# Patient Record
Sex: Female | Born: 1985
Health system: Southern US, Community
[De-identification: ages and names within clinical notes are randomized; demographics above are authoritative.]

## PROBLEM LIST (undated history)

## (undated) DIAGNOSIS — F32A Depression, unspecified: Secondary | ICD-10-CM

## (undated) DIAGNOSIS — M940 Chondrocostal junction syndrome [Tietze]: Secondary | ICD-10-CM

## (undated) HISTORY — DX: Depression, unspecified: F32.A

## (undated) HISTORY — PX: ADENOIDECTOMY: SUR15

## (undated) HISTORY — PX: WISDOM TOOTH EXTRACTION: SHX21

## (undated) HISTORY — PX: OTHER SURGICAL HISTORY: SHX169

## (undated) HISTORY — PX: BREAST SURGERY: SHX581

---

## 2014-05-24 ENCOUNTER — Other Ambulatory Visit (HOSPITAL_COMMUNITY)
Admission: RE | Admit: 2014-05-24 | Discharge: 2014-05-24 | Disposition: A | Discharge status: Home / Self Care | Payer: 59 | Source: Ambulatory Visit | Attending: Obstetrics & Gynecology | Admitting: Obstetrics & Gynecology

## 2014-05-24 DIAGNOSIS — Z01419 Encounter for gynecological examination (general) (routine) without abnormal findings: Secondary | ICD-10-CM | POA: Diagnosis not present

## 2014-05-24 DIAGNOSIS — Z1151 Encounter for screening for human papillomavirus (HPV): Secondary | ICD-10-CM | POA: Diagnosis present

## 2014-05-24 DIAGNOSIS — Z113 Encounter for screening for infections with a predominantly sexual mode of transmission: Secondary | ICD-10-CM | POA: Diagnosis present

## 2014-08-19 ENCOUNTER — Encounter (HOSPITAL_COMMUNITY): Payer: Self-pay | Admitting: *Deleted

## 2014-08-19 ENCOUNTER — Emergency Department (HOSPITAL_COMMUNITY): Payer: 59

## 2014-08-19 ENCOUNTER — Emergency Department (HOSPITAL_COMMUNITY)
Admission: EM | Admit: 2014-08-19 | Discharge: 2014-08-19 | Disposition: A | Discharge status: Home / Self Care | Payer: 59 | Attending: Emergency Medicine | Admitting: Emergency Medicine

## 2014-08-19 DIAGNOSIS — R112 Nausea with vomiting, unspecified: Secondary | ICD-10-CM | POA: Insufficient documentation

## 2014-08-19 DIAGNOSIS — R059 Cough, unspecified: Secondary | ICD-10-CM

## 2014-08-19 DIAGNOSIS — M791 Myalgia: Secondary | ICD-10-CM | POA: Diagnosis not present

## 2014-08-19 DIAGNOSIS — R05 Cough: Secondary | ICD-10-CM | POA: Diagnosis present

## 2014-08-19 HISTORY — DX: Chondrocostal junction syndrome (tietze): M94.0

## 2014-08-19 MED ORDER — ONDANSETRON 4 MG PO TBDP: 4.0000 mg | ORAL_TABLET | Freq: Three times a day (TID) | ORAL | Status: DC | PRN

## 2014-08-19 MED ORDER — HYDROCOD POLST-CHLORPHEN POLST 10-8 MG/5ML PO LQCR
5.0000 mL | Freq: Two times a day (BID) | ORAL | Status: DC | PRN
Start: 2014-08-19 — End: 2018-03-19

## 2014-08-19 NOTE — ED Notes (Signed)
Patient transported to X-ray 

## 2014-08-19 NOTE — ED Provider Notes (Signed)
CSN: [id number]     Arrival date & time 08/19/14  0700 History   First MD Initiated Contact with Patient 08/19/14 [phone]     Chief Complaint  Patient presents with  . Cough     (Consider location/radiation/quality/duration/timing/severity/associated sxs/prior Treatment) Patient is a 29 y.o. female presenting with cough. The history is provided by the patient and medical records.  Cough Associated symptoms: myalgias    This is a 29 y.o. F with no significant PMH presenting to the ED for cough, subjective fever, body aches, nausea, and vomiting intermittently for the past 5 days.  She states the nausea and vomiting have resolved but she continues with all of her other symptoms. States cough is been productive with thick, white sputum but she became concerned this morning when there were small streaks of blood present. Patient denies any chest pain or shortness of breath. 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 She has no known cardiac history. No history of DVT or PE. No intervention tried prior to arrival.  VSS.  Past Medical History  Diagnosis Date  . Costochondritis    History reviewed. No pertinent past surgical history. History reviewed. No pertinent family history. History  Substance Use Topics  . Smoking status: Not on file  . Smokeless tobacco: Not on file  . Alcohol Use: Yes     Comment: occ   OB History    No data available     Review of Systems  Respiratory: Positive for cough.   Gastrointestinal: Positive for nausea and vomiting.  Musculoskeletal: Positive for myalgias.  All other systems reviewed and are negative.     Allergies  Review of patient's allergies indicates no known allergies.  Home Medications   Prior to Admission medications   Not on File   BP 109/73 mmHg  Pulse 84  Temp(Src) 98.4 F (36.9 C) (Oral)  Resp 20  Ht 5\' 4"  (1.626 m)  Wt 120 lb (54.432 kg)  BMI 20.59 kg/m2  SpO2 98%  LMP  08/14/2014   Physical Exam  Constitutional: She is oriented to person, place, and time. She appears well-developed and well-nourished. No distress.  HENT:  Head: Normocephalic and atraumatic.  Right Ear: Tympanic membrane and ear canal normal.  Left Ear: Tympanic membrane and ear canal normal.  Nose: Nose normal.  Mouth/Throat: Oropharynx is clear and moist.  Tonsils normal in appearance bilaterally without exudate; uvula midline without peritonsillar abscess; handling secretions appropriately; no difficulty swallowing or speaking  Eyes: Conjunctivae and EOM are normal. Pupils are equal, round, and reactive to light.  Neck: Normal range of motion. Neck supple.  Cardiovascular: Normal rate, regular rhythm and normal heart sounds.   Pulmonary/Chest: Effort normal and breath sounds normal. No respiratory distress. She has no wheezes. She has no rhonchi. She has no rales.  Respirations unlabored, lungs clear bilaterally, O2 sats stable on room air; dry cough noted  Abdominal: Soft. Bowel sounds are normal. There is no tenderness. There is no guarding and no CVA tenderness.  Musculoskeletal: Normal range of motion. She exhibits no edema.  No calf asymmetry, tenderness, or palpable cords No overlying skin changes or warmth to touch DP pulses intact bilaterally  Neurological: She is alert and oriented to person, place, and time.  Skin: Skin is warm and dry. She is not diaphoretic.  Psychiatric: She has a normal mood and affect.  Nursing note and vitals reviewed.   ED Course  Procedures (  including critical care time) Labs Review Labs Reviewed - No data to display  Imaging Review Dg Chest 2 View  08/19/2014   CLINICAL DATA:  Hematemesis, fever, chills, productive cough since 5 days ago  EXAM: CHEST  2 VIEW  COMPARISON:  None.  FINDINGS: The heart size and mediastinal contours are within normal limits. Both lungs are clear. The visualized skeletal structures are unremarkable.  IMPRESSION: No  active cardiopulmonary disease.   Electronically Signed   By: Judie Petit.  Shick M.D.   On: 08/19/2014 08:20     EKG Interpretation None      MDM   Final diagnoses:  Cough   29 year old female with URI type symptoms over the past week. Nausea and vomiting have resolved, continues having productive cough. States this morning she had streaks of blood present in her sputum and became concerned. On exam, patient is afebrile and nontoxic in appearance. She has a dry cough, but lungs are clear and there is no evidence of respiratory distress. Chest x-ray is negative for acute findings. No clinical signs of DVT on exam and patient is PERC negative making PE unlikely. Also doubt ACS.  Patient will be d/c home with supportive care.  FU with PCP.  Discussed plan with patient, he/she acknowledged understanding and agreed with plan of care.  Return precautions given for new or worsening symptoms.  Garlon Hatchet, PA-C 08/19/14 1030  Benjiman Core, MD 08/19/14 [phone]

## 2014-08-19 NOTE — Discharge Instructions (Signed)
Take the prescribed medication as directed.  Rest and drink plenty of fluids. °Follow-up with your primary care physician. °Return to the ED for new or worsening symptoms. ° °

## 2014-08-19 NOTE — ED Notes (Signed)
Pt reports not feeling well since tues. Having productive cough and fever. Now coughing up blood. Mask on pt at triage.

## 2014-08-19 NOTE — ED Notes (Signed)
Lisa, PA at the bedside.  

## 2014-09-01 ENCOUNTER — Ambulatory Visit (INDEPENDENT_AMBULATORY_CARE_PROVIDER_SITE_OTHER): Payer: Self-pay | Admitting: Emergency Medicine

## 2014-09-01 VITALS — BP 100/68 | HR 92 | Temp 97.9°F | Resp 20 | Ht 65.0 in | Wt 121.8 lb

## 2014-09-01 DIAGNOSIS — Z021 Encounter for pre-employment examination: Secondary | ICD-10-CM

## 2014-09-01 DIAGNOSIS — Z029 Encounter for administrative examinations, unspecified: Secondary | ICD-10-CM

## 2014-09-01 NOTE — Progress Notes (Signed)
  [facility] [address], [place] [address] [phone]  Date:  09/01/2014   Name:  Crystal Arellano   DOB:  Jun 20, 1985   MRN:  [id number]  PCP:  No primary care provider on file.    Chief Complaint: Employment Physical   History of Present Illness:  Crystal FurbishShante Karolee StampsHarris Arellano is a 29 y.o. very pleasant female patient who presents with the following:  BLET exam  There are no active problems to display for this patient.   Past Medical History  Diagnosis Date  . Costochondritis     Past Surgical History  Procedure Laterality Date  . Breast surgery      History  Substance Use Topics  . Smoking status: Never Smoker   . Smokeless tobacco: Not on file  . Alcohol Use: 0.0 oz/week    0 Standard drinks or equivalent per week     Comment: occ    Family History  Problem Relation Age of Onset  . Cancer Mother   . Hyperlipidemia Mother     No Known Allergies  Medication list has been reviewed and updated.  Current Outpatient Prescriptions on File Prior to Visit  Medication Sig Dispense Refill  . chlorpheniramine-HYDROcodone (TUSSIONEX PENNKINETIC ER) 10-8 MG/5ML LQCR Take 5 mLs by mouth every 12 (twelve) hours as needed for cough (Cough). (Patient not taking: Reported on 09/01/2014) 75 mL 0  . ondansetron (ZOFRAN ODT) 4 MG disintegrating tablet Take 1 tablet (4 mg total) by mouth every 8 (eight) hours as needed for nausea. (Patient not taking: Reported on 09/01/2014) 10 tablet 0   No current facility-administered medications on file prior to visit.    Review of Systems:  As per HPI, otherwise negative.    Physical Examination: Filed Vitals:   09/01/14 1406  BP: 100/68  Pulse: 92  Temp: 97.9 F (36.6 C)  Resp: 20   Filed Vitals:   09/01/14 1406  Height: 5\' 5"  (1.651 m)  Weight: 121 lb 12.8 oz (55.248 kg)   Body mass index is 20.27 kg/(m^2). Ideal Body Weight: Weight in (lb) to have BMI = 25: 149.9  GEN: WDWN, NAD, Non-toxic,  A & O x 3 HEENT: Atraumatic, Normocephalic. Neck supple. No masses, No LAD. Ears and Nose: No external deformity. CV: RRR, No M/G/R. No JVD. No thrill. No extra heart sounds. PULM: CTA B, no wheezes, crackles, rhonchi. No retractions. No resp. distress. No accessory muscle use. ABD: S, NT, ND, +BS. No rebound. No HSM. EXTR: No c/c/e NEURO Normal gait.  PSYCH: Normally interactive. Conversant. Not depressed or anxious appearing.  Calm demeanor.    Assessment and Plan: Administrative exam  Signed,  Phillips OdorJeffery Aiana Nordquist, MD

## 2014-11-09 ENCOUNTER — Ambulatory Visit
Admission: RE | Admit: 2014-11-09 | Discharge: 2014-11-09 | Disposition: A | Discharge status: Home / Self Care | Payer: 59 | Source: Ambulatory Visit | Attending: Nurse Practitioner | Admitting: Nurse Practitioner

## 2014-11-09 ENCOUNTER — Other Ambulatory Visit: Payer: Self-pay | Admitting: Nurse Practitioner

## 2014-11-09 DIAGNOSIS — R109 Unspecified abdominal pain: Secondary | ICD-10-CM

## 2014-11-13 ENCOUNTER — Other Ambulatory Visit: Payer: Self-pay | Admitting: Internal Medicine

## 2014-11-13 DIAGNOSIS — R1012 Left upper quadrant pain: Secondary | ICD-10-CM

## 2014-11-14 ENCOUNTER — Ambulatory Visit
Admission: RE | Admit: 2014-11-14 | Discharge: 2014-11-14 | Disposition: A | Discharge status: Home / Self Care | Payer: 59 | Source: Ambulatory Visit | Attending: Internal Medicine | Admitting: Internal Medicine

## 2014-11-14 DIAGNOSIS — R1012 Left upper quadrant pain: Secondary | ICD-10-CM

## 2015-12-27 ENCOUNTER — Ambulatory Visit
Admission: RE | Admit: 2015-12-27 | Discharge: 2015-12-27 | Disposition: A | Discharge status: Home / Self Care | Payer: BLUE CROSS/BLUE SHIELD | Source: Ambulatory Visit | Attending: Family Medicine | Admitting: Family Medicine

## 2015-12-27 ENCOUNTER — Other Ambulatory Visit: Payer: Self-pay | Admitting: Family Medicine

## 2015-12-27 DIAGNOSIS — R61 Generalized hyperhidrosis: Secondary | ICD-10-CM | POA: Insufficient documentation

## 2017-03-06 ENCOUNTER — Ambulatory Visit (HOSPITAL_COMMUNITY)
Admission: EM | Admit: 2017-03-06 | Discharge: 2017-03-06 | Disposition: A | Discharge status: Home / Self Care | Payer: BLUE CROSS/BLUE SHIELD | Attending: Family | Admitting: Family

## 2017-03-06 ENCOUNTER — Encounter (HOSPITAL_COMMUNITY): Payer: Self-pay | Admitting: Emergency Medicine

## 2017-03-06 DIAGNOSIS — R21 Rash and other nonspecific skin eruption: Secondary | ICD-10-CM | POA: Diagnosis not present

## 2017-03-06 MED ORDER — PREDNISONE 10 MG PO TABS: ORAL_TABLET | ORAL | 0 refills | Status: DC

## 2017-03-06 NOTE — ED Triage Notes (Signed)
Pt c/o rash on forehead yesterday.

## 2017-03-06 NOTE — Discharge Instructions (Signed)
As discussed, suspect contact dermatitis as recently let hair down. We'll start oral prednisone since in your hairline and worsening. Start prednisone tomorrow as may interfere with sleep.  Benadryl OR zyrtec for itching.  If there is no improvement in your symptoms, or if there is any worsening of symptoms, or if you have any additional concerns, please return for re-evaluation; or, if we are closed, consider going to the Emergency Room for evaluation if symptoms urgent.

## 2017-03-06 NOTE — ED Provider Notes (Signed)
[facility]    CSN: [id number] Arrival date & time: 03/06/17  1628     History   Chief Complaint Chief Complaint  Patient presents with  . Rash    HPI Crystal Arellano is a 31 y.o. female.   Chief complaint of rash on bilateral forehead under hair line, 1 day, unchanged. Thinks has spread from left of forehead and now to right.  Tried witch hazel and cortizone with no relief. Itchy. No drainage.   No fever, vomiting, headache, sore throat, ear pain, cough, vision changes, abdominal pain.   New detergents, creams, lotions, medication. Recently states she has grown her hair and ( first since 2008) and has let braid down on face, 3 days ago. NO other changes she can recall.   NO one else in household has rash.   No ho mrsa.   H/o cold sore on lip however has been years since last sore.       Past Medical History:  Diagnosis Date  . Costochondritis     There are no active problems to display for this patient.   Past Surgical History:  Procedure Laterality Date  . ADENOIDECTOMY    . BREAST SURGERY      OB History    No data available       Home Medications    Prior to Admission medications   Medication Sig Start Date End Date Taking? Authorizing Provider  chlorpheniramine-HYDROcodone (TUSSIONEX PENNKINETIC ER) 10-8 MG/5ML LQCR Take 5 mLs by mouth every 12 (twelve) hours as needed for cough (Cough). Patient not taking: Reported on 09/01/2014 08/19/14   Garlon Hatchet, PA-C  ondansetron (ZOFRAN ODT) 4 MG disintegrating tablet Take 1 tablet (4 mg total) by mouth every 8 (eight) hours as needed for nausea. Patient not taking: Reported on 09/01/2014 08/19/14   Garlon Hatchet, PA-C  predniSONE (DELTASONE) 10 MG tablet Take 40 mg by mouth on day 1, then taper 10 mg daily until gone 03/06/17   Allegra Grana, FNP    Family History Family History  Problem Relation Age of Onset  . Cancer Mother   . Hyperlipidemia Mother     Social  History Social History  Substance Use Topics  . Smoking status: Never Smoker  . Smokeless tobacco: Not on file  . Alcohol use 0.0 oz/week     Comment: occ     Allergies   Patient has no known allergies.   Review of Systems Review of Systems  Constitutional: Negative for chills and fever.  Respiratory: Negative for cough.   Cardiovascular: Negative for chest pain and palpitations.  Gastrointestinal: Negative for nausea and vomiting.  Skin: Positive for rash.     Physical Exam Triage Vital Signs ED Triage Vitals [03/06/17 1641]  Enc Vitals Group     BP 111/67     Pulse Rate 95     Resp 18     Temp 98.1 F (36.7 C)     Temp Source Oral     SpO2 99 %     Weight      Height      Head Circumference      Peak Flow      Pain Score      Pain Loc      Pain Edu?      Excl. in GC?    No data found.   Updated Vital Signs BP 111/67   Pulse 95   Temp 98.1 F (36.7 C) (Oral)  Resp 18   LMP 03/05/2017   SpO2 99%   Visual Acuity Right Eye Distance:   Left Eye Distance:   Bilateral Distance:    Right Eye Near:   Left Eye Near:    Bilateral Near:     Physical Exam  Constitutional: She appears well-developed and well-nourished.  HENT:  Head: Normocephalic and atraumatic.  Right Ear: Hearing, tympanic membrane, external ear and ear canal normal. No drainage, swelling or tenderness. No foreign bodies. Tympanic membrane is not erythematous and not bulging. No middle ear effusion. No decreased hearing is noted.  Left Ear: Hearing, tympanic membrane, external ear and ear canal normal. No drainage, swelling or tenderness. No foreign bodies. Tympanic membrane is not erythematous and not bulging.  No middle ear effusion. No decreased hearing is noted.  Nose: Nose normal. No rhinorrhea. Right sinus exhibits no maxillary sinus tenderness and no frontal sinus tenderness. Left sinus exhibits no maxillary sinus tenderness and no frontal sinus tenderness.  Mouth/Throat: Uvula is  midline, oropharynx is clear and moist and mucous membranes are normal. No oropharyngeal exudate, posterior oropharyngeal edema, posterior oropharyngeal erythema or tonsillar abscesses.  Eyes: Conjunctivae are normal.  Cardiovascular: Normal rate, regular rhythm, normal heart sounds and normal pulses.   Pulmonary/Chest: Effort normal and breath sounds normal. She has no wheezes. She has no rhonchi. She has no rales.  Lymphadenopathy:       Head (right side): No submental, no submandibular, no tonsillar, no preauricular, no posterior auricular and no occipital adenopathy present.       Head (left side): No submental, no submandibular, no tonsillar, no preauricular, no posterior auricular and no occipital adenopathy present.    She has no cervical adenopathy.  Neurological: She is alert.  Skin: Skin is warm and dry. Rash noted. Rash is vesicular.     Grouped vescicular lesions noted in bilateral areas of hairline as marked on diagram.   Psychiatric: She has a normal mood and affect. Her speech is normal and behavior is normal. Thought content normal.  Vitals reviewed.    UC Treatments / Results  Labs (all labs ordered are listed, but only abnormal results are displayed) Labs Reviewed - No data to display  EKG  EKG Interpretation None       Radiology No results found.  Procedures Procedures (including critical care time)  Medications Ordered in UC Medications - No data to display   Initial Impression / Assessment and Plan / UC Course  I have reviewed the triage vital signs and the nursing notes.  Pertinent labs & imaging results that were available during my care of the patient were reviewed by me and considered in my medical decision making (see chart for details).       Final Clinical Impressions(s) / UC Diagnoses   Final diagnoses:  Rash  patient is well-appearing. Afebrile tonight. Etiology of rash nonspecific .Working diagnosis of contact dermatitis from putting  hair down, perhaps from chemical in hair/shampoo. We jointly agreed low clinical suspicion for bacterial infection and based on the symmetry and being beyond the hairline as well as onset after putting her down, suspect contact dermatitis most likely. She does have a history of cold sores and I advised her if prednisone does not help, please let me know as antiviral be appropriate next step. Advised close vigilance especially as we do not know the etiology of rash at this time. Return precautions given. New Prescriptions Discharge Medication List as of 03/06/2017  5:56 PM  Controlled Substance Prescriptions [place] Controlled Substance Registry consulted? Not Applicable   Allegra Granarnett, Ormond Lazo G, FNP 03/06/17 1950

## 2018-03-19 ENCOUNTER — Encounter (HOSPITAL_COMMUNITY): Payer: Self-pay | Admitting: Emergency Medicine

## 2018-03-19 ENCOUNTER — Other Ambulatory Visit: Payer: Self-pay

## 2018-03-19 ENCOUNTER — Emergency Department (HOSPITAL_BASED_OUTPATIENT_CLINIC_OR_DEPARTMENT_OTHER): Payer: BLUE CROSS/BLUE SHIELD

## 2018-03-19 ENCOUNTER — Emergency Department (HOSPITAL_COMMUNITY)
Admission: EM | Admit: 2018-03-19 | Discharge: 2018-03-19 | Disposition: A | Discharge status: Home / Self Care | Payer: BLUE CROSS/BLUE SHIELD | Attending: Emergency Medicine | Admitting: Emergency Medicine

## 2018-03-19 DIAGNOSIS — Z79899 Other long term (current) drug therapy: Secondary | ICD-10-CM | POA: Insufficient documentation

## 2018-03-19 DIAGNOSIS — M7989 Other specified soft tissue disorders: Secondary | ICD-10-CM

## 2018-03-19 DIAGNOSIS — M79661 Pain in right lower leg: Secondary | ICD-10-CM | POA: Diagnosis not present

## 2018-03-19 DIAGNOSIS — F419 Anxiety disorder, unspecified: Secondary | ICD-10-CM | POA: Diagnosis not present

## 2018-03-19 DIAGNOSIS — M79609 Pain in unspecified limb: Secondary | ICD-10-CM

## 2018-03-19 MED ORDER — DICLOFENAC SODIUM 1 % TD GEL: 4.0000 g | Freq: Four times a day (QID) | TRANSDERMAL | 0 refills | Status: AC

## 2018-03-19 MED ORDER — METHOCARBAMOL 500 MG PO TABS: 500.0000 mg | ORAL_TABLET | Freq: Two times a day (BID) | ORAL | 0 refills | Status: AC

## 2018-03-19 NOTE — ED Triage Notes (Signed)
Pt complaint of right calf swelling and pain for 2 days; denies injury. Has recently traveled to [place] and worked out at gym.

## 2018-03-19 NOTE — ED Provider Notes (Signed)
[facility]EMERGENCY [facility]PT Provider Note   CSN: [id number] Arrival date & time: 03/19/18  1337     History   Chief Complaint Chief Complaint  Patient presents with  . Leg Pain    HPI Crystal Arellano is a 32 y.o. female.  HPI   Crystal Arellano is a 32 y.o. female, with a history of costochondritis and anxiety, presenting to the ED with right calf pain for last 3 days.  Describes the pain as burning and sometimes a tightness, moderate, radiating throughout the calf.  She states her wife thinks there is some swelling. Only recent travel was to [place] about 2 weeks ago.  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 She does not know if these things are relevant.  Denies history of PE/DVT, recent fractures, surgeries, or use of hormones.  Denies fever, numbness, weakness, chest pain, shortness of breath, or any other complaints.     Past Medical History:  Diagnosis Date  . Costochondritis     There are no active problems to display for this patient.   Past Surgical History:  Procedure Laterality Date  . ADENOIDECTOMY    . BREAST SURGERY       OB History   None      Home Medications    Prior to Admission medications   Medication Sig Start Date End Date Taking? Authorizing Provider  CAPSAICIN HOT PATCH EX Apply 1 patch topically as needed (leg pain).   Yes [provider]  FLUoxetine (PROZAC) 20 MG capsule Take 20 mg by mouth daily.   Yes [provider]  tretinoin (RETIN-A) 0.025 % cream Apply 1 application topically at bedtime.  01/12/18  Yes [provider]  diclofenac sodium (VOLTAREN) 1 % GEL Apply 4 g topically 4 (four) times daily. 03/19/18   Taniela Feltus C, PA-C  methocarbamol (ROBAXIN) 500 MG tablet Take 1 tablet (500 mg total) by mouth 2 (two) times daily. 03/19/18   Soma Bachand, Hillard Danker, PA-C    Family History Family  History  Problem Relation Age of Onset  . Cancer Mother   . Hyperlipidemia Mother     Social History Social History   Tobacco Use  . Smoking status: Never Smoker  Substance Use Topics  . Alcohol use: Yes    Alcohol/week: 0.0 standard drinks    Comment: occ  . Drug use: No     Allergies   Patient has no known allergies.   Review of Systems Review of Systems  Constitutional: Negative for chills, diaphoresis and fever.  Respiratory: Negative for cough and shortness of breath.   Cardiovascular: Negative for chest pain and leg swelling.  Musculoskeletal: Positive for myalgias.  Neurological: Negative for dizziness, syncope, weakness, light-headedness and numbness.  All other systems reviewed and are negative.    Physical Exam Updated Vital Signs BP 126/81 (BP Location: Left Arm)   Pulse 83   Temp 98.4 F (36.9 C) (Oral)   Resp 16   LMP 03/19/2018   SpO2 100%   Physical Exam  Constitutional: She appears well-developed and well-nourished. No distress.  HENT:  Head: Normocephalic and atraumatic.  Eyes: Conjunctivae are normal.  Neck: Neck supple.  Cardiovascular: Normal rate, regular rhythm, normal heart sounds and intact distal pulses.  Pulses:      Dorsalis pedis pulses are 2+ on the right side.  Posterior tibial pulses are 2+ on the right side.  Pulmonary/Chest: Effort normal and breath sounds normal. No respiratory distress.  No increased work of breathing.  Speaks in full sentences without difficulty.  Abdominal: Soft. There is no tenderness. There is no guarding.  Musculoskeletal: She exhibits no edema.       Right lower leg: She exhibits tenderness. She exhibits no swelling and no deformity.  Tenderness throughout the right calf.  No noted increased warmth, erythema, or swelling.  No tenderness to the anterior right lower leg, right knee, or right ankle. Full range of motion in the knee and ankle without difficulty or pain.  Lymphadenopathy:    She  has no cervical adenopathy.  Neurological: She is alert.  Sensation to light touch grossly intact in the right lower extremity. Strength 5/5 in the right lower extremity. Ambulatory without assistance.  Skin: Skin is warm and dry. Capillary refill takes less than 2 seconds. She is not diaphoretic.  Psychiatric: She has a normal mood and affect. Her behavior is normal.  Nursing note and vitals reviewed.    ED Treatments / Results  Labs (all labs ordered are listed, but only abnormal results are displayed) Labs Reviewed - No data to display  EKG None  Radiology No results found.  Right lower extremity venous duplex completed. Preliminary results - There is no evidence lf a DVT or Baker's cyst. [place] Slaughter,RVS 03/19/2018, 2:39 PM   Procedures Procedures (including critical care time)  Medications Ordered in ED Medications - No data to display   Initial Impression / Assessment and Plan / ED Course  I have reviewed the triage vital signs and the nursing notes.  Pertinent labs & imaging results that were available during my care of the patient were reviewed by me and considered in my medical decision making (see chart for details).     Patient presents with right calf pain.  Neurovascularly intact.  No evidence of DVT on duplex ultrasound.  PCP versus orthopedic follow-up. The patient was given instructions for home care as well as return precautions. Patient voices understanding of these instructions, accepts the plan, and is comfortable with discharge.    Final Clinical Impressions(s) / ED Diagnoses   Final diagnoses:  Right calf pain    ED Discharge Orders         Ordered    methocarbamol (ROBAXIN) 500 MG tablet  2 times daily     03/19/18 1450    diclofenac sodium (VOLTAREN) 1 % GEL  4 times daily     03/19/18 1450           Anselm Pancoast, PA-C 03/19/18 1529    Lorre Nick, MD 03/22/18 [phone]

## 2018-03-19 NOTE — Progress Notes (Signed)
Right lower extremity venous duplex completed. Preliminary results - There is no evidence lf a DVT or Baker's cyst. Crystal Arellano 03/19/2018, 2:39 PM

## 2018-03-19 NOTE — Discharge Instructions (Addendum)
There was no evidence of blood clot on the ultrasound.  Take it easy, but do not lay around too much as this may make any stiffness worse.  Antiinflammatory medications: Take 600 mg of ibuprofen every 6 hours or 440 mg (over the counter dose) to 500 mg (prescription dose) of naproxen every 12 hours for the next 3 days. After this time, these medications may be used as needed for pain. Take these medications with food to avoid upset stomach. Choose only one of these medications, do not take them together. Acetaminophen (generic for Tylenol): Should you continue to have additional pain while taking the ibuprofen or naproxen, you may add in acetaminophen as needed. Your daily total maximum amount of acetaminophen from all sources should be limited to 4000mg /day for persons without liver problems, or 2000mg /day for those with liver problems. Muscle relaxer: Robaxin is a muscle relaxer and may help loosen stiff muscles. Do not take the Robaxin while driving or performing other dangerous activities.  Diclofenac gel: As an alternative to the above medications, you may apply diclofenac gel for targeted pain relief.  This medication is a topical anti-inflammatory. Exercises: Be sure to perform the attached exercises starting with three times a week and working up to performing them daily. This is an essential part of preventing long term problems.  Follow up: Follow up with a primary care provider for any future management of these complaints. Be sure to follow up within 7-10 days. Return: Return to the ED should symptoms worsen.  For prescription assistance, may try using prescription discount sites or apps, such as goodrx.com

## 2018-11-12 ENCOUNTER — Telehealth: Payer: Self-pay

## 2018-11-12 NOTE — Telephone Encounter (Signed)
Voice mail left for pt to call [facility] Dept for their recommendations since she lives in [place].  Phone number of [phone] left for her to call.  Advised her to call us back with their recommendations.

## 2018-11-12 NOTE — Telephone Encounter (Signed)
Discussed case with Lelon Frohlich this morning and rec'ed patient call HD where she resides in [place] for further rec's.  Await their rec's for next steps.    [facility]

## 2018-11-12 NOTE — Telephone Encounter (Signed)
  1. Do you have a fever? No 2. Are you having chills? No 3. Do you have a sore throat? No 4. Are you experiencing resp S/Sx -cough, SOB? "I always cough really bad at night because of post nasal drip".  Hasn't noticed any diffierence - no increase 5. Do you have muscle aches? No 6. Are you experiencing N/V/D? "Little bit of vomiting because of being on my menstrual cycle". 7. Experiencing loss of sense of taste and/or smell? No 8. Do you have a headache? No 9. Have you had contact with a person confirmed Positive for Covid-19? No 10. Have you traveled outside of [place]? Yes - went to [place], [place] 10/27/2018-10/31/2018  Wife is running a fever (low grade). No other S/Sx.   Employer took her out of work today & sending her for [place] testing today between between 9-11 at [facility].  They live in [place].

## 2018-11-12 NOTE — Telephone Encounter (Signed)
Spoke with [employer].  States she called [place] a 2nd time & still got their voice mail.  She then called [facility] Dept.  They advised her to stay at home tomorrow & advised her to go to [facility] [place] for COVID testing.  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  Era Bumpers our fax number as requested by her.  She will call us on Monday to update her status.  She is still asymtopmatic.

## 2018-11-15 ENCOUNTER — Other Ambulatory Visit: Payer: Self-pay | Admitting: Internal Medicine

## 2018-11-15 ENCOUNTER — Other Ambulatory Visit: Payer: 59

## 2018-11-15 ENCOUNTER — Telehealth: Payer: Self-pay

## 2018-11-15 DIAGNOSIS — Z20822 Contact with and (suspected) exposure to covid-19: Secondary | ICD-10-CM

## 2018-11-15 DIAGNOSIS — Z20828 Contact with and (suspected) exposure to other viral communicable diseases: Secondary | ICD-10-CM

## 2018-11-15 NOTE — Telephone Encounter (Signed)
Incoming call from [place], of [facility]. Requesting Pt. Be  Tested for Covid-19. Call to Pt.  Pt. Scheduled for later today @ [place] location.

## 2018-11-15 NOTE — Telephone Encounter (Signed)
Spoke with [place] today.  States she was unable to get COVID testing on Saturday because her car wouldn't start.  She called [facility] today & they sent her to [facility] at 3:00pm today.  States wife's test results came by Sunday & they are negative, but she's out of work for 7 days because she had a symptom.  Audreena to advise of her results.  Advised to continue to self-isolate until she gets her results.

## 2018-11-18 LAB — NOVEL CORONAVIRUS, NAA: SARS-CoV-2, NAA: NOT DETECTED

## 2018-11-18 NOTE — Telephone Encounter (Signed)
Spoke with [employer].  States COVID Test negative.  States she got her results through [place] today.  She remains asymptomatic.  Will send RTW form to her supervisor.

## 2019-02-15 ENCOUNTER — Ambulatory Visit: Payer: Self-pay

## 2019-02-15 DIAGNOSIS — Z23 Encounter for immunization: Secondary | ICD-10-CM

## 2019-04-21 ENCOUNTER — Ambulatory Visit: Payer: Self-pay

## 2019-05-11 DIAGNOSIS — R69 Illness, unspecified: Secondary | ICD-10-CM | POA: Diagnosis not present

## 2019-05-17 DIAGNOSIS — R69 Illness, unspecified: Secondary | ICD-10-CM | POA: Diagnosis not present

## 2019-06-03 DIAGNOSIS — R69 Illness, unspecified: Secondary | ICD-10-CM | POA: Diagnosis not present

## 2019-06-13 DIAGNOSIS — R69 Illness, unspecified: Secondary | ICD-10-CM | POA: Diagnosis not present

## 2019-06-15 DIAGNOSIS — R69 Illness, unspecified: Secondary | ICD-10-CM | POA: Diagnosis not present

## 2019-06-20 DIAGNOSIS — R69 Illness, unspecified: Secondary | ICD-10-CM | POA: Diagnosis not present

## 2019-06-27 DIAGNOSIS — R69 Illness, unspecified: Secondary | ICD-10-CM | POA: Diagnosis not present

## 2019-06-28 ENCOUNTER — Ambulatory Visit: Payer: Self-pay

## 2019-06-29 DIAGNOSIS — R69 Illness, unspecified: Secondary | ICD-10-CM | POA: Diagnosis not present

## 2019-07-13 DIAGNOSIS — R69 Illness, unspecified: Secondary | ICD-10-CM | POA: Diagnosis not present

## 2019-07-15 DIAGNOSIS — R69 Illness, unspecified: Secondary | ICD-10-CM | POA: Diagnosis not present

## 2019-07-18 DIAGNOSIS — R69 Illness, unspecified: Secondary | ICD-10-CM | POA: Diagnosis not present

## 2019-07-25 DIAGNOSIS — R69 Illness, unspecified: Secondary | ICD-10-CM | POA: Diagnosis not present

## 2019-07-29 DIAGNOSIS — R69 Illness, unspecified: Secondary | ICD-10-CM | POA: Diagnosis not present

## 2019-08-16 ENCOUNTER — Other Ambulatory Visit: Payer: Self-pay

## 2019-08-16 DIAGNOSIS — F419 Anxiety disorder, unspecified: Secondary | ICD-10-CM

## 2019-08-16 NOTE — Telephone Encounter (Signed)
Reviewed Epic no office visits noted in 2020 with [facility] [place] providers.  Will review paper chart when onsite in clinic 08/17/2019

## 2019-08-17 MED ORDER — FLUOXETINE HCL 20 MG PO CAPS: 20.0000 mg | ORAL_CAPSULE | Freq: Every day | ORAL | 0 refills | Status: DC

## 2019-08-17 NOTE — Telephone Encounter (Signed)
Paper chart reviewed.  Last office visit with me for anxiety 06/10/2018 PHQ-9 score 1 and GAD 7 score 8 labs 07/06/2018 and annual physical with Dr Dorris Fetch Feb 2020. [place] refill 90 days entered for patient as has been on medication for years Patient needs to schedule annual visit with [facility] provider as labs and office visit overdue.

## 2019-08-25 NOTE — Telephone Encounter (Signed)
Noted follow up appt June

## 2019-09-02 DIAGNOSIS — R69 Illness, unspecified: Secondary | ICD-10-CM | POA: Diagnosis not present

## 2019-09-26 DIAGNOSIS — R69 Illness, unspecified: Secondary | ICD-10-CM | POA: Diagnosis not present

## 2019-10-06 DIAGNOSIS — R197 Diarrhea, unspecified: Secondary | ICD-10-CM | POA: Diagnosis not present

## 2019-10-06 DIAGNOSIS — Z03818 Encounter for observation for suspected exposure to other biological agents ruled out: Secondary | ICD-10-CM | POA: Diagnosis not present

## 2019-10-09 ENCOUNTER — Other Ambulatory Visit: Payer: Self-pay

## 2019-10-09 ENCOUNTER — Encounter (HOSPITAL_COMMUNITY): Payer: Self-pay | Admitting: Emergency Medicine

## 2019-10-09 ENCOUNTER — Emergency Department (HOSPITAL_COMMUNITY)
Admission: EM | Admit: 2019-10-09 | Discharge: 2019-10-09 | Disposition: A | Discharge status: Home / Self Care | Payer: 59 | Attending: Emergency Medicine | Admitting: Emergency Medicine

## 2019-10-09 ENCOUNTER — Emergency Department (HOSPITAL_COMMUNITY): Payer: 59

## 2019-10-09 DIAGNOSIS — S93601A Unspecified sprain of right foot, initial encounter: Secondary | ICD-10-CM | POA: Insufficient documentation

## 2019-10-09 DIAGNOSIS — Y929 Unspecified place or not applicable: Secondary | ICD-10-CM | POA: Diagnosis not present

## 2019-10-09 DIAGNOSIS — Y9301 Activity, walking, marching and hiking: Secondary | ICD-10-CM | POA: Insufficient documentation

## 2019-10-09 DIAGNOSIS — X501XXA Overexertion from prolonged static or awkward postures, initial encounter: Secondary | ICD-10-CM | POA: Insufficient documentation

## 2019-10-09 DIAGNOSIS — Y999 Unspecified external cause status: Secondary | ICD-10-CM | POA: Insufficient documentation

## 2019-10-09 DIAGNOSIS — R52 Pain, unspecified: Secondary | ICD-10-CM

## 2019-10-09 DIAGNOSIS — S99921A Unspecified injury of right foot, initial encounter: Secondary | ICD-10-CM | POA: Diagnosis not present

## 2019-10-09 MED ORDER — IBUPROFEN 400 MG PO TABS
600.0000 mg | ORAL_TABLET | Freq: Once | ORAL | Status: AC
  Administered 2019-10-09: 600 mg via ORAL
  Filled 2019-10-09: qty 1

## 2019-10-09 NOTE — ED Provider Notes (Signed)
[facility] EMERGENCY DEPARTMENT Provider Note   CSN: [id number] Arrival date & time: 10/09/19  0753     History Chief Complaint  Patient presents with  . Foot Pain    Crystal Arellano is a 34 y.o. female.  34 year old female presents with complaint of pain in her right foot.  Patient states that she was walking when she twisted her foot accidentally and now has pain along the lateral aspect of the right foot with swelling and bruising.  Patient is ambulatory with some discomfort.  No other injuries or concerns.        Past Medical History:  Diagnosis Date  . Costochondritis     There are no problems to display for this patient.   Past Surgical History:  Procedure Laterality Date  . ADENOIDECTOMY    . BREAST SURGERY       OB History   No obstetric history on file.     Family History  Problem Relation Age of Onset  . Cancer Mother   . Hyperlipidemia Mother     Social History   Tobacco Use  . Smoking status: Never Smoker  . Smokeless tobacco: Never Used  Substance Use Topics  . Alcohol use: Yes    Alcohol/week: 0.0 standard drinks    Comment: occ  . Drug use: No    Home Medications Prior to Admission medications   Medication Sig Start Date End Date Taking? Authorizing Provider  CAPSAICIN HOT PATCH EX Apply 1 patch topically as needed (leg pain).    [provider]  diclofenac sodium (VOLTAREN) 1 % GEL Apply 4 g topically 4 (four) times daily. 03/19/18   Joy, Shawn C, PA-C  FLUoxetine (PROZAC) 20 MG capsule Take 1 capsule (20 mg total) by mouth daily. 08/17/19   Betancourt, Aura Fey, NP  methocarbamol (ROBAXIN) 500 MG tablet Take 1 tablet (500 mg total) by mouth 2 (two) times daily. 03/19/18   Joy, Shawn C, PA-C  tretinoin (RETIN-A) 0.025 % cream Apply 1 application topically at bedtime.  01/12/18   [provider]    Allergies    Patient has no known allergies.  Review of Systems   Review of Systems    Constitutional: Negative for fever.  Musculoskeletal: Positive for arthralgias, gait problem, joint swelling and myalgias.  Skin: Negative for rash and wound.  Allergic/Immunologic: Negative for immunocompromised state.  Neurological: Negative for weakness and numbness.    Physical Exam Updated Vital Signs BP 108/82 (BP Location: Right Arm)   Pulse 87   Temp 98.6 F (37 C) (Oral)   Resp 16   Ht 5\' 5"  (1.651 m)   Wt 64 kg   LMP 10/02/2019   SpO2 99%   BMI 23.46 kg/m   Physical Exam Vitals and nursing note reviewed.  Constitutional:      General: She is not in acute distress.    Appearance: She is well-developed. She is not diaphoretic.  HENT:     Head: Normocephalic and atraumatic.  Cardiovascular:     Pulses: Normal pulses.  Pulmonary:     Effort: Pulmonary effort is normal.  Musculoskeletal:        General: Swelling and tenderness present. No deformity. Normal range of motion.     Right ankle: No tenderness. No lateral malleolus, medial malleolus or base of 5th metatarsal tenderness. Normal range of motion. Normal pulse.     Right foot: Normal range of motion. Swelling and tenderness present. No deformity or laceration.  Legs:  Skin:    General: Skin is warm and dry.     Findings: No erythema or rash.  Neurological:     Mental Status: She is alert and oriented to person, place, and time.     Sensory: No sensory deficit.  Psychiatric:        Behavior: Behavior normal.     ED Results / Procedures / Treatments   Labs (all labs ordered are listed, but only abnormal results are displayed) Labs Reviewed - No data to display  EKG None  Radiology DG Foot Complete Right  Result Date: 10/09/2019 CLINICAL DATA:  Recent twisting injury with lateral foot pain, initial encounter EXAM: RIGHT FOOT COMPLETE - 3+ VIEW COMPARISON:  None. FINDINGS: There is no evidence of fracture or dislocation. There is no evidence of arthropathy or other focal bone abnormality. Soft  tissues are unremarkable. IMPRESSION: No acute abnormality noted. Electronically Signed   By: Alcide Clever M.D.   On: 10/09/2019 08:44    Procedures Procedures (including critical care time)  Medications Ordered in ED Medications  ibuprofen (ADVIL) tablet 600 mg (has no administration in time range)    ED Course  I have reviewed the triage vital signs and the nursing notes.  Pertinent labs & imaging results that were available during my care of the patient were reviewed by me and considered in my medical decision making (see chart for details).  Clinical Course as of Oct 09 910  Sun Oct 09, 2019  2047 34 year old female with pain to lateral aspect of her right foot after twisting her foot this morning.  On exam has slight swelling with ecchymosis and tenderness along the proximal third and fourth metatarsals, pain with inversion of the foot.  Sensation and pulses present.  No tenderness in the ankle or along the fifth metatarsal.  X-ray is negative for fracture.  Patient will be placed in a postop shoe, given crutches and advised to weight-bear as tolerated, recommend ice and elevate, take Motrin Tylenol and recheck with orthopedics in 1 week if pain persists.   [LM]    Clinical Course User Index [LM] Alden Hipp   MDM Rules/Calculators/A&P                      Final Clinical Impression(s) / ED Diagnoses Final diagnoses:  Pain  Sprain of right foot, initial encounter    Rx / DC Orders ED Discharge Orders    None       Jeannie Fend, PA-C 10/09/19 3785    Pricilla Loveless, MD 10/12/19 [phone]

## 2019-10-09 NOTE — Discharge Instructions (Addendum)
Apply ice for 20 minutes and elevate foot at least 3 times daily. Take Motrin and Tylenol as needed as directed. Follow-up with orthopedics if pain is not improving after 1 week.

## 2019-10-09 NOTE — ED Triage Notes (Signed)
C/o R foot pain and swelling since this morning.  States she was walking backwards and twisted foot.

## 2019-10-09 NOTE — ED Notes (Signed)
Patient verbalizes understanding of discharge instructions . Opportunity for questions and answers were provided . Armband removed by staff ,Pt discharged from ED. W/C  offered at D/C  and Declined W/C at D/C and was escorted to lobby by RN.  

## 2019-10-11 ENCOUNTER — Ambulatory Visit: Payer: Self-pay | Admitting: Emergency Medicine

## 2019-10-11 ENCOUNTER — Other Ambulatory Visit: Payer: Self-pay

## 2019-10-11 ENCOUNTER — Encounter: Payer: Self-pay | Admitting: Emergency Medicine

## 2019-10-11 VITALS — BP 122/86 | HR 89 | Temp 98.4°F | Resp 14 | Ht 65.0 in | Wt 141.0 lb

## 2019-10-11 DIAGNOSIS — Z7689 Persons encountering health services in other specified circumstances: Secondary | ICD-10-CM

## 2019-10-11 NOTE — Progress Notes (Signed)
  [facility] Provider Note       Time seen: 3:21 PM    I have reviewed the vital signs and the nursing notes.  HISTORY   Chief Complaint Follow-up (sprain right ankle need clairty for work)    HPI Crystal Arellano is a 34 y.o. female with a history of costochondritis who presents today for reevaluation concerning recent right foot pain.  Patient states he feels better and she has no complaints now.  Past Medical History:  Diagnosis Date  . Costochondritis     Past Surgical History:  Procedure Laterality Date  . ADENOIDECTOMY    . BREAST SURGERY      Allergies Patient has no known allergies.   Review of Systems Constitutional: Negative for fever. Cardiovascular: Negative for chest pain. Respiratory: Negative for shortness of breath. Gastrointestinal: Negative for abdominal pain, vomiting and diarrhea. Musculoskeletal: Positive for right foot pain Skin: Negative for rash. Neurological: Negative for headaches, focal weakness or numbness.  All systems negative/normal/unremarkable except as stated in the HPI  ____________________________________________   PHYSICAL EXAM:  VITAL SIGNS: Vitals:   10/11/19 1513  BP: 122/86  Pulse: 89  Resp: 14  Temp: 98.4 F (36.9 C)  SpO2: 96%    Constitutional: Alert and oriented. Well appearing and in no distress. Musculoskeletal: Mild dorsal lateral tenderness of the right foot.  No bruising or swelling is noted.  No tenderness over the plantar surface. Neurologic:  Normal speech and language. No gross focal neurologic deficits are appreciated.  Skin:  Skin is warm, dry and intact. No rash noted. Psychiatric: Speech and behavior are normal.   DIFFERENTIAL DIAGNOSIS  Right foot strain, sprain  ASSESSMENT AND PLAN  Right foot strain   Plan: The patient had presented for work clearance.  She does not have any significant complaints at this time and has no trouble walking.  She is  cleared to return to full activity.  Daryel November MD    Note: This note was generated in part or whole with voice recognition software. Voice recognition is usually quite accurate but there are transcription errors that can and very often do occur. I apologize for any typographical errors that were not detected and corrected.

## 2019-10-24 NOTE — Progress Notes (Deleted)
Scheduled to complete physical 11/02/19 with Ron Smith, PA-C.  AMD 

## 2019-10-31 NOTE — Progress Notes (Signed)
Scheduled to complete physical 11/16/19 with Durward Parcel, PA-C.  AMD

## 2019-11-01 ENCOUNTER — Other Ambulatory Visit: Payer: Self-pay

## 2019-11-01 ENCOUNTER — Ambulatory Visit: Payer: Self-pay

## 2019-11-01 DIAGNOSIS — Z0000 Encounter for general adult medical examination without abnormal findings: Secondary | ICD-10-CM

## 2019-11-01 LAB — POCT URINALYSIS DIPSTICK
Bilirubin, UA: NEGATIVE
Blood, UA: NEGATIVE
Glucose, UA: NEGATIVE
Ketones, UA: NEGATIVE
Leukocytes, UA: NEGATIVE
Nitrite, UA: NEGATIVE
Protein, UA: NEGATIVE
Spec Grav, UA: 1.015 (ref 1.010–1.025)
Urobilinogen, UA: 0.2 E.U./dL
pH, UA: 6 (ref 5.0–8.0)

## 2019-11-02 LAB — CMP12+LP+TP+TSH+6AC+CBC/D/PLT
ALT: 9 IU/L (ref 0–32)
AST: 13 IU/L (ref 0–40)
Albumin/Globulin Ratio: 2 (ref 1.2–2.2)
Albumin: 4.7 g/dL (ref 3.8–4.8)
Alkaline Phosphatase: 57 IU/L (ref 48–121)
BUN/Creatinine Ratio: 14 (ref 9–23)
BUN: 11 mg/dL (ref 6–20)
Basophils Absolute: 0.1 10*3/uL (ref 0.0–0.2)
Basos: 1 %
Bilirubin Total: 0.3 mg/dL (ref 0.0–1.2)
Calcium: 8.9 mg/dL (ref 8.7–10.2)
Chloride: 105 mmol/L (ref 96–106)
Chol/HDL Ratio: 4.5 ratio — ABNORMAL HIGH (ref 0.0–4.4)
Cholesterol, Total: 237 mg/dL — ABNORMAL HIGH (ref 100–199)
Creatinine, Ser: 0.77 mg/dL (ref 0.57–1.00)
EOS (ABSOLUTE): 0 10*3/uL (ref 0.0–0.4)
Eos: 1 %
Estimated CHD Risk: 1.1 times avg. — ABNORMAL HIGH (ref 0.0–1.0)
Free Thyroxine Index: 2.1 (ref 1.2–4.9)
GFR calc Af Amer: 115 mL/min/{1.73_m2} (ref >59)
GFR calc non Af Amer: 100 mL/min/{1.73_m2} (ref >59)
GGT: 16 IU/L (ref 0–60)
Globulin, Total: 2.4 g/dL (ref 1.5–4.5)
Glucose: 87 mg/dL (ref 65–99)
HDL: 53 mg/dL (ref >39)
Hematocrit: 39.1 % (ref 34.0–46.6)
Hemoglobin: 12.5 g/dL (ref 11.1–15.9)
Immature Grans (Abs): 0 10*3/uL (ref 0.0–0.1)
Immature Granulocytes: 0 %
Iron: 71 ug/dL (ref 27–159)
LDH: 184 IU/L (ref 119–226)
LDL Chol Calc (NIH): 174 mg/dL — ABNORMAL HIGH (ref 0–99)
Lymphocytes Absolute: 1.9 10*3/uL (ref 0.7–3.1)
Lymphs: 38 %
MCH: 26.9 pg (ref 26.6–33.0)
MCHC: 32 g/dL (ref 31.5–35.7)
MCV: 84 fL (ref 79–97)
Monocytes Absolute: 0.3 10*3/uL (ref 0.1–0.9)
Monocytes: 6 %
Neutrophils Absolute: 2.7 10*3/uL (ref 1.4–7.0)
Neutrophils: 54 %
Phosphorus: 3.5 mg/dL (ref 3.0–4.3)
Platelets: 401 10*3/uL (ref 150–450)
Potassium: 4.4 mmol/L (ref 3.5–5.2)
RBC: 4.64 x10E6/uL (ref 3.77–5.28)
RDW: 13.6 % (ref 11.7–15.4)
Sodium: 138 mmol/L (ref 134–144)
T3 Uptake Ratio: 26 % (ref 24–39)
T4, Total: 8.2 ug/dL (ref 4.5–12.0)
TSH: 1 u[IU]/mL (ref 0.450–4.500)
Total Protein: 7.1 g/dL (ref 6.0–8.5)
Triglycerides: 58 mg/dL (ref 0–149)
Uric Acid: 3.5 mg/dL (ref 2.6–6.2)
VLDL Cholesterol Cal: 10 mg/dL (ref 5–40)
WBC: 5 10*3/uL (ref 3.4–10.8)

## 2019-11-10 NOTE — Telephone Encounter (Signed)
Appt scheduled 11/16/2019 patient no showed previously scheduled per Epic

## 2019-11-16 ENCOUNTER — Other Ambulatory Visit: Payer: Self-pay

## 2019-11-16 ENCOUNTER — Encounter: Payer: Self-pay | Admitting: Emergency Medicine

## 2019-11-16 ENCOUNTER — Ambulatory Visit: Payer: Self-pay | Admitting: Emergency Medicine

## 2019-11-16 VITALS — BP 110/70 | HR 87 | Temp 99.0°F | Resp 16 | Ht 65.0 in | Wt 142.0 lb

## 2019-11-16 DIAGNOSIS — Z0000 Encounter for general adult medical examination without abnormal findings: Secondary | ICD-10-CM

## 2019-11-16 NOTE — Progress Notes (Signed)
Provider Note       Time seen: 10:49 AM    I have reviewed the vital signs and the nursing notes.  HISTORY   Chief Complaint Employment Physical   HPI Crystal Arellano is a 34 y.o. female with a history of costochondritis, breast surgery who presents today for annual physical examination.  She denies any specific complaints, was recently seen for foot pain which still occasionally bothers her.  Past Medical History:  Diagnosis Date  . Costochondritis     Past Surgical History:  Procedure Laterality Date  . ADENOIDECTOMY    . BREAST SURGERY      Allergies Patient has no known allergies.   Review of Systems Constitutional: Negative for fever. Cardiovascular: Negative for chest pain. Respiratory: Negative for shortness of breath. Gastrointestinal: Negative for abdominal pain, vomiting and diarrhea. Musculoskeletal: Negative for back pain. Skin: Negative for rash. Neurological: Negative for headaches, focal weakness or numbness.  All systems negative/normal/unremarkable except as stated in the HPI  ____________________________________________   PHYSICAL EXAM:  VITAL SIGNS: Vitals:   11/16/19 1040  BP: 110/70  Pulse: 87  Resp: 16  Temp: 99 F (37.2 C)  SpO2: 97%    Constitutional: Alert and oriented. Well appearing and in no distress. Eyes: Conjunctivae are normal. Normal extraocular movements. ENT      Head: Normocephalic and atraumatic.      Nose: No congestion/rhinnorhea.      Mouth/Throat: Mucous membranes are moist.      Neck: No stridor. Cardiovascular: Normal rate, regular rhythm. No murmurs, rubs, or gallops. Respiratory: Normal respiratory effort without tachypnea nor retractions. Breath sounds are clear and equal bilaterally. No wheezes/rales/rhonchi. Gastrointestinal: Soft and nontender. Normal bowel sounds Musculoskeletal: Nontender with normal range of motion in extremities. No lower extremity tenderness nor edema. Neurologic:   Normal speech and language. No gross focal neurologic deficits are appreciated.  Skin: Mild facial acne is noted Psychiatric: Speech and behavior are normal.    ____________________________________________   LABS (pertinent positives/negatives)  Recent Results (from the past 2160 hour(s))  CMP12+LP+TP+TSH+6AC+CBC/D/Plt     Status: Abnormal   Collection Time: 11/01/19  9:05 AM  Result Value Ref Range   Glucose 87 65 - 99 mg/dL   Uric Acid 3.5 2.6 - 6.2 mg/dL    Comment:            Therapeutic target for gout patients: <6.0   BUN 11 6 - 20 mg/dL   Creatinine, Ser 0.77 0.57 - 1.00 mg/dL   GFR calc non Af Amer 100 >59 mL/min/1.73   GFR calc Af Amer 115 >59 mL/min/1.73    Comment: **[facility] currently reports eGFR in compliance with the current**   recommendations of the [employer]. [facility] will   update reporting as new guidelines are published from the NKF-ASN   Task force.    BUN/Creatinine Ratio 14 9 - 23   Sodium 138 134 - 144 mmol/L   Potassium 4.4 3.5 - 5.2 mmol/L   Chloride 105 96 - 106 mmol/L   Calcium 8.9 8.7 - 10.2 mg/dL   Phosphorus 3.5 3.0 - 4.3 mg/dL   Total Protein 7.1 6.0 - 8.5 g/dL   Albumin 4.7 3.8 - 4.8 g/dL   Globulin, Total 2.4 1.5 - 4.5 g/dL   Albumin/Globulin Ratio 2.0 1.2 - 2.2   Bilirubin Total 0.3 0.0 - 1.2 mg/dL   Alkaline Phosphatase 57 48 - 121 IU/L   LDH 184 119 - 226 IU/L   AST 13 0 -  40 IU/L   ALT 9 0 - 32 IU/L   GGT 16 0 - 60 IU/L   Iron 71 27 - 159 ug/dL   Cholesterol, Total 237 (H) 100 - 199 mg/dL   Triglycerides 58 0 - 149 mg/dL   HDL 53 >39 mg/dL   VLDL Cholesterol Cal 10 5 - 40 mg/dL   LDL Chol Calc (NIH) 174 (H) 0 - 99 mg/dL   Chol/HDL Ratio 4.5 (H) 0.0 - 4.4 ratio    Comment:                                   T. Chol/HDL Ratio                                             Men  Women                               1/2 Avg.Risk  3.4    3.3                                   Avg.Risk  5.0    4.4                                 2X Avg.Risk  9.6    7.1                                3X Avg.Risk 23.4   11.0    Estimated CHD Risk 1.1 (H) 0.0 - 1.0 times avg.    Comment: The CHD Risk is based on the T. Chol/HDL ratio. Other factors affect CHD Risk such as hypertension, smoking, diabetes, severe obesity, and family history of premature CHD.    TSH 1.000 0.450 - 4.500 uIU/mL   T4, Total 8.2 4.5 - 12.0 ug/dL   T3 Uptake Ratio 26 24 - 39 %   Free Thyroxine Index 2.1 1.2 - 4.9   WBC 5.0 3.4 - 10.8 x10E3/uL   RBC 4.64 3.77 - 5.28 x10E6/uL   Hemoglobin 12.5 11.1 - 15.9 g/dL   Hematocrit 39.1 34.0 - 46.6 %   MCV 84 79 - 97 fL   MCH 26.9 26.6 - 33.0 pg   MCHC 32.0 31 - 35 g/dL   RDW 13.6 11.7 - 15.4 %   Platelets 401 150 - 450 x10E3/uL   Neutrophils 54 Not Estab. %   Lymphs 38 Not Estab. %   Monocytes 6 Not Estab. %   Eos 1 Not Estab. %   Basos 1 Not Estab. %   Neutrophils Absolute 2.7 1 - 7 x10E3/uL   Lymphocytes Absolute 1.9 0 - 3 x10E3/uL   Monocytes Absolute 0.3 0 - 0 x10E3/uL   EOS (ABSOLUTE) 0.0 0.0 - 0.4 x10E3/uL   Basophils Absolute 0.1 0 - 0 x10E3/uL   Immature Granulocytes 0 Not Estab. %   Immature Grans (Abs) 0.0 0.0 - 0.1 x10E3/uL  POCT urinalysis dipstick     Status: None   Collection Time: 11/01/19  9:09 AM  Result Value Ref  Range   Color, UA Yellow    Clarity, UA Clear    Glucose, UA Negative Negative   Bilirubin, UA Negative    Ketones, UA Negative    Spec Grav, UA 1.015 1.010 - 1.025   Blood, UA Negative    pH, UA 6.0 5.0 - 8.0   Protein, UA Negative Negative   Urobilinogen, UA 0.2 0.2 or 1.0 E.U./dL   Nitrite, UA Negative    Leukocytes, UA Negative Negative   Appearance     Odor      DIFFERENTIAL DIAGNOSIS  Mild hyperlipidemia, annual physical examination  ASSESSMENT AND PLAN  Annual physical examination   Plan: The patient had presented for annual examination. Patient's labs did indicate mild hyperlipidemia.  We have discussed dietary changes, otherwise she is cleared  for follow-up as needed.  Lenise Arena MD    Note: This note was generated in part or whole with voice recognition software. Voice recognition is usually quite accurate but there are transcription errors that can and very often do occur. I apologize for any typographical errors that were not detected and corrected.

## 2019-12-18 ENCOUNTER — Other Ambulatory Visit: Payer: Self-pay | Admitting: Registered Nurse

## 2019-12-18 DIAGNOSIS — F419 Anxiety disorder, unspecified: Secondary | ICD-10-CM

## 2019-12-19 NOTE — Telephone Encounter (Signed)
COB pt. Thanks!

## 2019-12-20 NOTE — Telephone Encounter (Signed)
Patient had office visit with Dr Mayford Knife 11/16/2019

## 2020-09-03 ENCOUNTER — Telehealth: Payer: Self-pay | Admitting: Genetic Counselor

## 2020-09-03 NOTE — Telephone Encounter (Signed)
Scheduled appt per 4/18 referral. Pt aware.

## 2020-09-07 ENCOUNTER — Encounter: Payer: Self-pay | Admitting: Genetic Counselor

## 2020-09-12 ENCOUNTER — Inpatient Hospital Stay: Payer: 59

## 2020-09-12 ENCOUNTER — Inpatient Hospital Stay: Payer: 59 | Admitting: Genetic Counselor

## 2020-12-02 IMAGING — DX DG FOOT COMPLETE 3+V*R*
3 series · 3 of 3 positions shown · non-contrast
Comparison: None.

CLINICAL DATA: Recent twisting injury with lateral foot pain,
initial encounter

EXAM:
RIGHT FOOT COMPLETE - 3+ VIEW

[foot ap]
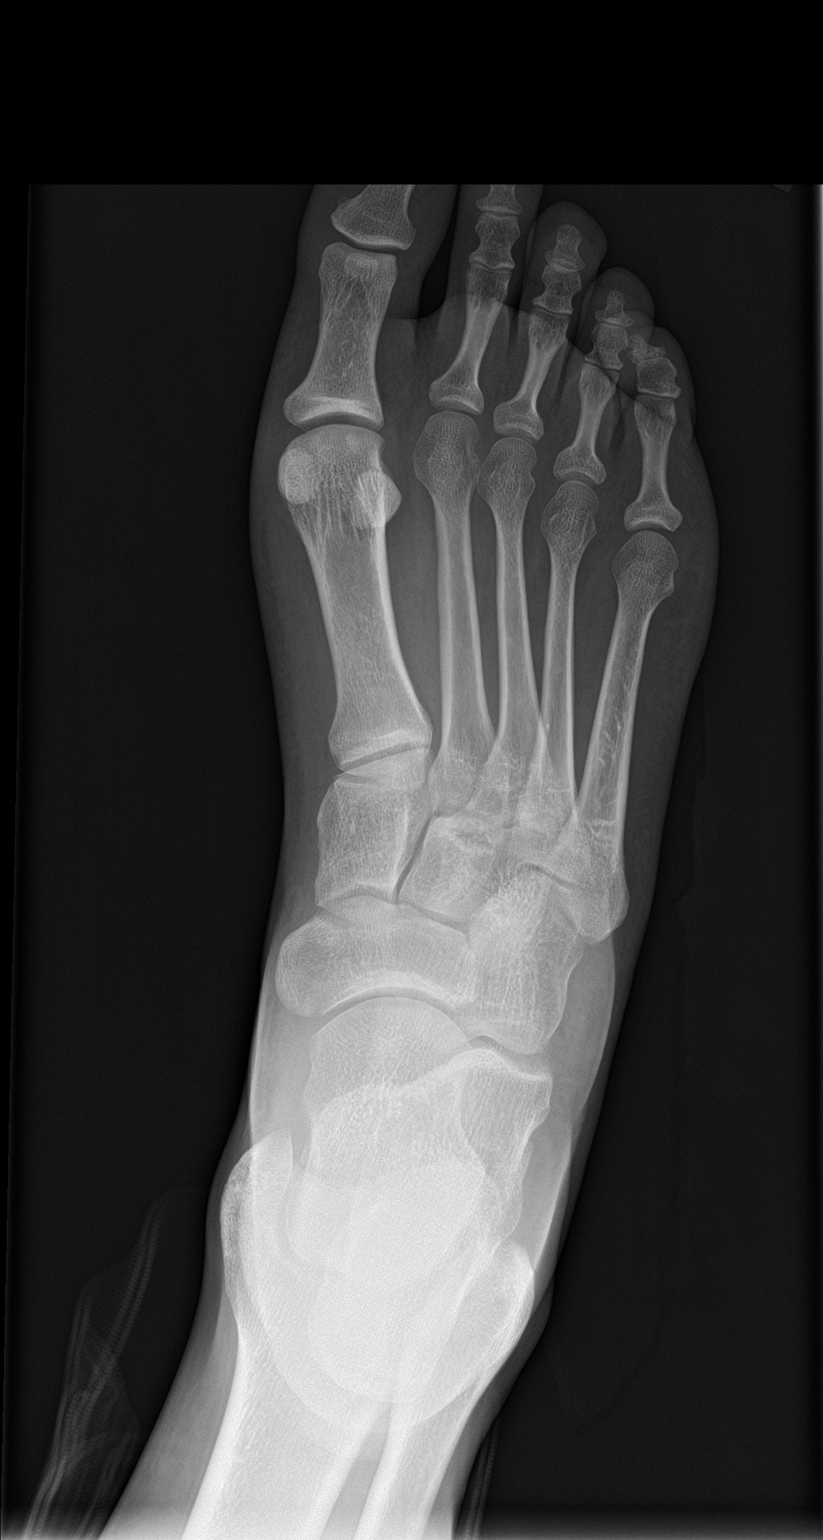

[foot obl]
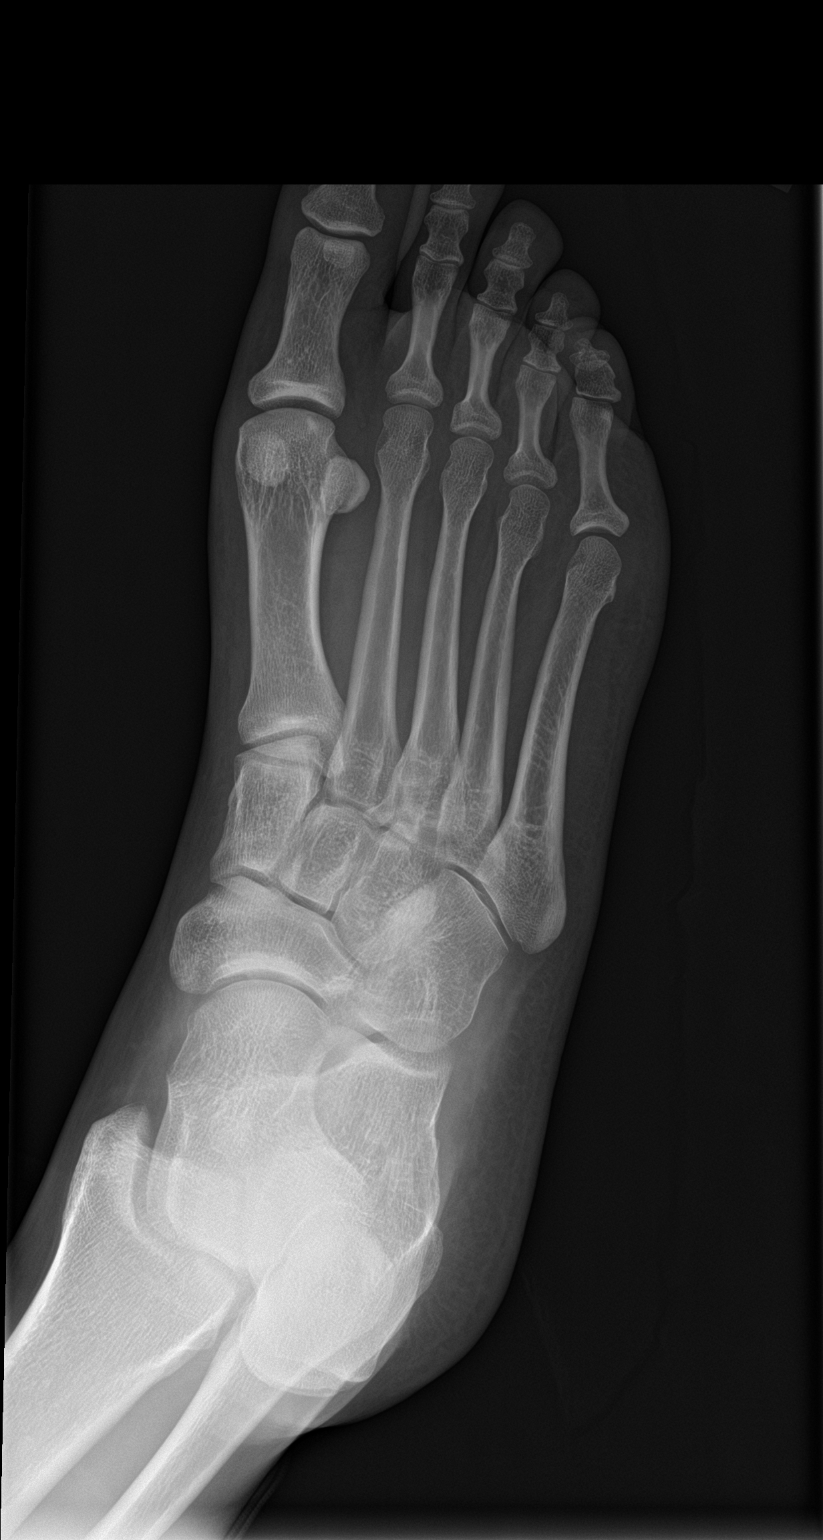

[foot lat]
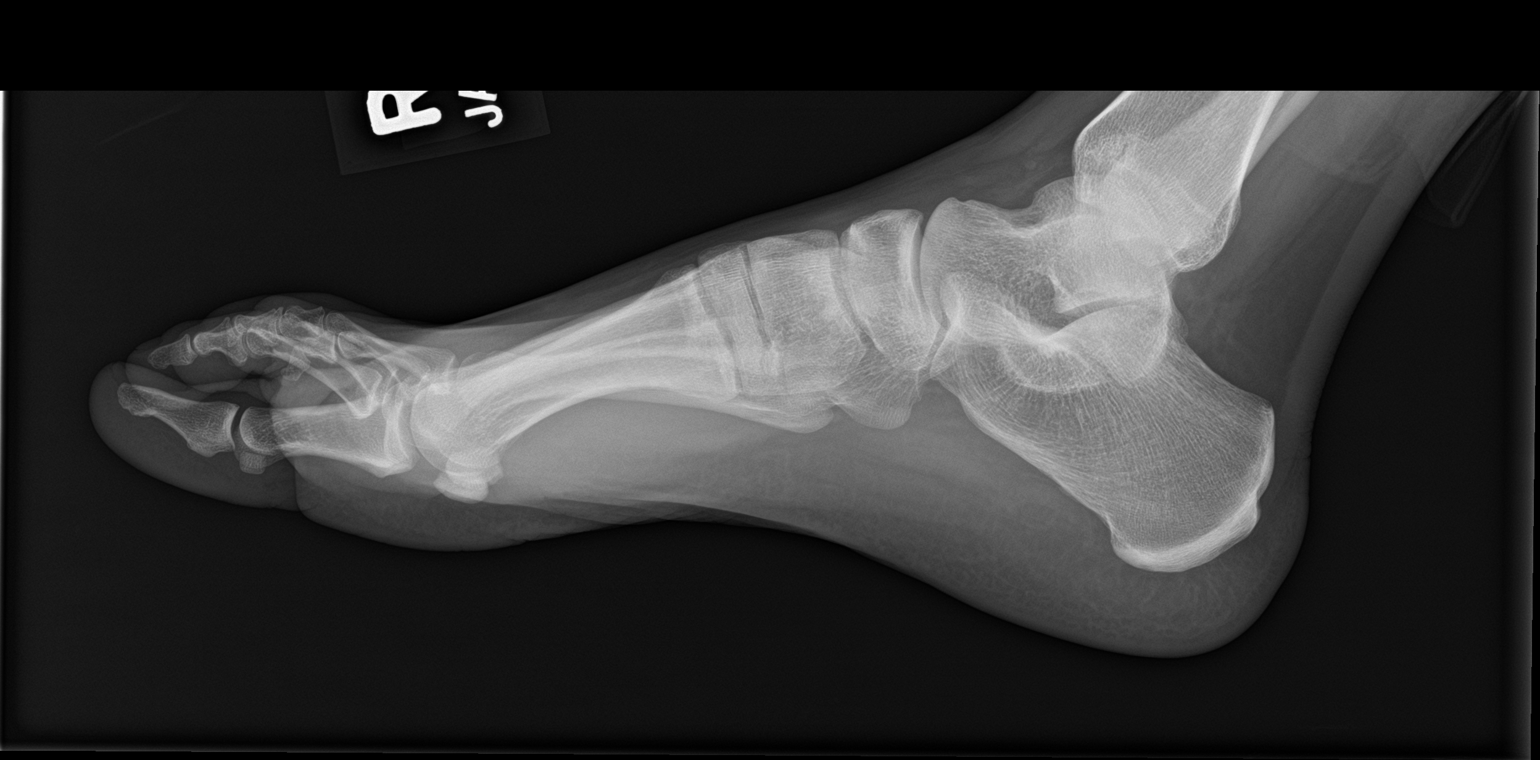

[3 of 3 positions shown; findings below may reference images not displayed]

FINDINGS: There is no evidence of fracture or dislocation. There is no
evidence of arthropathy or other focal bone abnormality. Soft
tissues are unremarkable.
IMPRESSION: No acute abnormality noted.

## 2022-06-03 ENCOUNTER — Emergency Department (HOSPITAL_BASED_OUTPATIENT_CLINIC_OR_DEPARTMENT_OTHER)
Admission: EM | Admit: 2022-06-03 | Discharge: 2022-06-03 | Disposition: A | Discharge status: Home / Self Care | Payer: BC Managed Care – PPO | Attending: Emergency Medicine | Admitting: Emergency Medicine

## 2022-06-03 ENCOUNTER — Other Ambulatory Visit: Payer: Self-pay

## 2022-06-03 DIAGNOSIS — R051 Acute cough: Secondary | ICD-10-CM | POA: Diagnosis not present

## 2022-06-03 DIAGNOSIS — R519 Headache, unspecified: Secondary | ICD-10-CM | POA: Insufficient documentation

## 2022-06-03 DIAGNOSIS — R0981 Nasal congestion: Secondary | ICD-10-CM | POA: Diagnosis not present

## 2022-06-03 DIAGNOSIS — Z20822 Contact with and (suspected) exposure to covid-19: Secondary | ICD-10-CM | POA: Diagnosis not present

## 2022-06-03 DIAGNOSIS — J029 Acute pharyngitis, unspecified: Secondary | ICD-10-CM | POA: Insufficient documentation

## 2022-06-03 LAB — RESP PANEL BY RT-PCR (RSV, FLU A&B, COVID)  RVPGX2
Influenza A by PCR: NEGATIVE
Influenza B by PCR: NEGATIVE
Resp Syncytial Virus by PCR: NEGATIVE
SARS Coronavirus 2 by RT PCR: NEGATIVE

## 2022-06-03 LAB — GROUP A STREP BY PCR: Group A Strep by PCR: NOT DETECTED

## 2022-06-03 LAB — PREGNANCY, URINE: Preg Test, Ur: NEGATIVE

## 2022-06-03 MED ORDER — BENZONATATE 100 MG PO CAPS: 100.0000 mg | ORAL_CAPSULE | Freq: Three times a day (TID) | ORAL | 0 refills | Status: DC

## 2022-06-03 MED ORDER — LIDOCAINE VISCOUS HCL 2 % MT SOLN
15.0000 mL | Freq: Once | OROMUCOSAL | Status: AC
  Administered 2022-06-03: 15 mL via OROMUCOSAL
  Filled 2022-06-03: qty 15

## 2022-06-03 MED ORDER — KETOROLAC TROMETHAMINE 15 MG/ML IJ SOLN
15.0000 mg | Freq: Once | INTRAMUSCULAR | Status: AC
  Administered 2022-06-03: 15 mg via INTRAMUSCULAR
  Filled 2022-06-03: qty 1

## 2022-06-03 NOTE — Discharge Instructions (Signed)
It was a pleasure taking care of you today.  As discussed, your COVID, flu, RSV, and strep test were negative.  I suspect you have a viral infection causing your symptoms.  I am sending you home with cough medication.  Take as needed.  Continue to take ibuprofen or Tylenol as needed for pain.  Return to the ER for new or worsening symptoms.  Follow-up with PCP within the next week.

## 2022-06-03 NOTE — ED Notes (Signed)
Discharge paperwork given and verbally understood. 

## 2022-06-03 NOTE — ED Triage Notes (Signed)
Pt to ED c/o cold symptoms x 3 days, cough, headache, congestion, body aches.

## 2022-06-03 NOTE — ED Provider Notes (Signed)
[facility] Provider Note   CSN: [id number] Arrival date & time: 06/03/22  1341     History  Chief Complaint  Patient presents with   URI    Crystal Arellano is a 37 y.o. female with no significant past medical history who presents to the ED due to sore throat, cough, nasal congestion, body aches x 3 days.  Wife sick with similar symptoms.  She also notes her child was sick last week.  Admits to subjective fever.  No chest pain or shortness of breath.  Denies abdominal pain, nausea, vomiting, or diarrhea.   History obtained from patient and past medical records. No interpreter used during encounter.       Home Medications Prior to Admission medications   Medication Sig Start Date End Date Taking? Authorizing Provider  benzonatate (TESSALON) 100 MG capsule Take 1 capsule (100 mg total) by mouth every 8 (eight) hours. 06/03/22  Yes Lyndsey Demos, Druscilla Brownie, PA-C  CAPSAICIN HOT PATCH EX Apply 1 patch topically as needed (leg pain). Patient not taking: Reported on 11/16/2019    [provider]  diclofenac sodium (VOLTAREN) 1 % GEL Apply 4 g topically 4 (four) times daily. Patient not taking: Reported on 10/11/2019 03/19/18   Arlean Hopping C, PA-C  FLUoxetine (PROZAC) 20 MG capsule TAKE 1 CAPSULE(20 MG) BY MOUTH DAILY 12/20/19   Earleen Newport, MD  methocarbamol (ROBAXIN) 500 MG tablet Take 1 tablet (500 mg total) by mouth 2 (two) times daily. Patient not taking: Reported on 10/11/2019 03/19/18   Arlean Hopping C, PA-C  tretinoin (RETIN-A) 0.025 % cream Apply 1 application topically at bedtime.  Patient not taking: Reported on 11/16/2019 01/12/18   [provider]      Allergies    Patient has no known allergies.    Review of Systems   Review of Systems  Constitutional:  Positive for chills and fever.  HENT:  Positive for congestion, rhinorrhea and sore throat.   Respiratory:  Positive for cough. Negative for shortness of breath.    Cardiovascular:  Negative for chest pain.  Gastrointestinal:  Negative for abdominal pain, diarrhea, nausea and vomiting.  Musculoskeletal:  Positive for myalgias.  All other systems reviewed and are negative.   Physical Exam Updated Vital Signs BP 97/61   Pulse (!) 101   Temp 98.9 F (37.2 C) (Oral)   Resp 16   Ht 5\' 5"  (1.651 m)   Wt 74.8 kg   LMP 05/07/2022   SpO2 100%   BMI 27.46 kg/m  Physical Exam Vitals and nursing note reviewed.  Constitutional:      General: She is not in acute distress.    Appearance: She is not ill-appearing.  HENT:     Head: Normocephalic.     Right Ear: Tympanic membrane normal.     Left Ear: Tympanic membrane normal.     Mouth/Throat:     Pharynx: Posterior oropharyngeal erythema present.  Eyes:     Pupils: Pupils are equal, round, and reactive to light.  Cardiovascular:     Rate and Rhythm: Normal rate and regular rhythm.     Pulses: Normal pulses.     Heart sounds: Normal heart sounds. No murmur heard.    No friction rub. No gallop.  Pulmonary:     Effort: Pulmonary effort is normal.     Breath sounds: Normal breath sounds.  Abdominal:     General: Abdomen is flat. There is no distension.     Palpations:  Abdomen is soft.     Tenderness: There is no abdominal tenderness. There is no guarding or rebound.  Musculoskeletal:        General: Normal range of motion.     Cervical back: Neck supple.  Skin:    General: Skin is warm and dry.  Neurological:     General: No focal deficit present.     Mental Status: She is alert.  Psychiatric:        Mood and Affect: Mood normal.        Behavior: Behavior normal.     ED Results / Procedures / Treatments   Labs (all labs ordered are listed, but only abnormal results are displayed) Labs Reviewed  RESP PANEL BY RT-PCR (RSV, FLU A&B, COVID)  [id number]  GROUP A STREP BY PCR  PREGNANCY, URINE    EKG None  Radiology No results found.  Procedures Procedures    Medications Ordered  in ED Medications  lidocaine (XYLOCAINE) 2 % viscous mouth solution 15 mL (15 mLs Mouth/Throat Given 06/03/22 1531)  ketorolac (TORADOL) 15 MG/ML injection 15 mg (15 mg Intramuscular Given 06/03/22 1611)    ED Course/ Medical Decision Making/ A&P                             Medical Decision Making Amount and/or Complexity of Data Reviewed Labs: ordered. Decision-making details documented in ED Course.  Risk Prescription drug management.   37 year old female presents to the ED due to cough, headache, nasal congestion, myalgias x 3 days.  Wife and child sick with similar symptoms.  No chest pain or shortness of breath.  Denies abdominal pain, nausea, vomiting, or diarrhea.  Upon arrival, patient tachycardic to 114 however, during my initial evaluation heart rate in the 90s.  Patient in no acute distress.  Reassuring physical exam.  Lungs clear to auscultation bilaterally.  Low suspicion for pneumonia.  No meningismus to suggest meningitis.  Mild erythema to throat. No abscess. Normal phonation. Patient tolerating oral secretions.  Abdomen soft, nondistended, nontender.  Doubt acute abdomen.  Suspect viral etiology.  RVP ordered.  Added strep test given erythema to throat.  RVP negative. Strep negative.  Suspect viral process.  Patient midst to improvement in pain after Toradol.  Patient discharged with symptomatic treatment.  Low suspicion for bacterial infection.  Patient stable for discharge. Strict ED precautions discussed with patient. Patient states understanding and agrees to plan. Patient discharged home in no acute distress and stable vitals       Final Clinical Impression(s) / ED Diagnoses Final diagnoses:  Sore throat  Acute cough    Rx / DC Orders ED Discharge Orders          Ordered    benzonatate (TESSALON) 100 MG capsule  Every 8 hours        06/03/22 1616              Suzy Bouchard, PA-C 32/35/57 3220    Campbell Stall P, DO 25/42/70 0000

## 2023-07-28 ENCOUNTER — Encounter (HOSPITAL_COMMUNITY): Payer: Self-pay | Admitting: Emergency Medicine

## 2023-07-28 ENCOUNTER — Ambulatory Visit: Payer: Self-pay | Admitting: Internal Medicine

## 2023-07-28 ENCOUNTER — Ambulatory Visit (HOSPITAL_COMMUNITY)
Admission: EM | Admit: 2023-07-28 | Discharge: 2023-07-28 | Disposition: A | Discharge status: Home / Self Care | Attending: Emergency Medicine | Admitting: Emergency Medicine

## 2023-07-28 DIAGNOSIS — K625 Hemorrhage of anus and rectum: Secondary | ICD-10-CM

## 2023-07-28 DIAGNOSIS — K5900 Constipation, unspecified: Secondary | ICD-10-CM

## 2023-07-28 MED ORDER — POLYETHYLENE GLYCOL 3350 17 G PO PACK: 17.0000 g | PACK | Freq: Every day | ORAL | 0 refills | Status: AC

## 2023-07-28 MED ORDER — PHENYLEPHRINE-MINERAL OIL-PET 0.25-14-74.9 % RE OINT: 1.0000 | TOPICAL_OINTMENT | Freq: Two times a day (BID) | RECTAL | 0 refills | Status: AC | PRN

## 2023-07-28 NOTE — Telephone Encounter (Signed)
 Copied from CRM [id number]. Topic: Clinical - Red Word Triage >> Jul 28, 2023  2:12 PM Yolanda T wrote: Red Word that prompted transfer to Nurse Triage: patient stated she noticed blood in her stool Friday and today. Patient stated her stool was hard.  Chief Complaint: Blood in stool Symptoms: Blood in stool Frequency: Two times Pertinent Negatives: Patient denies black stool Disposition: [] ED /[x] Urgent Care (no appt availability in office) / [] Appointment(In office/virtual)/ []  [facility]/ [] Home Care/ [] Refused Recommended Disposition /[] [facility]/ []  Follow-up with PCP Additional Notes: Patient called in to report blood in her stool. Patient stated she has seen blood in her stool twice since Friday. Patient stated the blood is medium/dark in color and amounts to 2 tsp. Patient stated the stool appears brown in color. Patient denied blackening of stools. Patient denied abdominal pain. Patient is not established, but has a new patient appointment in April. This RN advised UC, per protocol. Patient complied and stated she would head that way.   Reason for Disposition  MILD rectal bleeding (more than just a few drops or streaks)  Answer Assessment - Initial Assessment Questions 1. APPEARANCE of BLOOD: "What color is it?" "Is it passed separately, on the surface of the stool, or mixed in with the stool?"      States blood was mixed in with stool, states blood looks medium/dark 2. AMOUNT: "How much blood was passed?"      2 tsp 3. FREQUENCY: "How many times has blood been passed with the stools?"      Twice 4. ONSET: "When was the blood first seen in the stools?" (Days or weeks)      Friday and then saw it again today 5. DIARRHEA: "Is there also some diarrhea?" If Yes, ask: "How many diarrhea stools in the past 24 hours?"      Denies 6. CONSTIPATION: "Do you have constipation?" If Yes, ask: "How bad is it?"     Slight constipation 7. RECURRENT SYMPTOMS: "Have you had  blood in your stools before?" If Yes, ask: "When was the last time?" and "What happened that time?"      Denies 8. BLOOD THINNERS: "Do you take any blood thinners?" (e.g., Coumadin/warfarin, Pradaxa/dabigatran, aspirin)     Denies 9. OTHER SYMPTOMS: "Do you have any other symptoms?"  (e.g., abdomen pain, vomiting, dizziness, fever)     States stool is brown, denies black stools, denies abdominal pain at this time, states upper abdomen hurt around 07/10/23, denies itching and burning of rectal area  Protocols used: Rectal Bleeding-A-AH

## 2023-07-28 NOTE — Discharge Instructions (Signed)
 You can apply preparation H ointment to assist with any rectal discomfort and presence of possible hemorrhoid.  Otherwise I recommend taking Miralax once daily to help regulate bowel movements. Ideally, you should be having at least one bowel movement per day.   Try eating a diet high in fiber or taking fiber supplements to assist with constipation. Also be sure to drink lots of water as this can assist with constipation as well.   Keep your appointment with your new primary care provider in April to follow-up about this issue.  Return here as needed.  If you develop severe abdominal pain, excessive vomiting, increased blood in your stool, or noticed black stool please seek immediate medical treatment in the ER.

## 2023-07-28 NOTE — ED Provider Notes (Signed)
 [facility]    CSN: [id number] Arrival date & time: 07/28/23  1449      History   Chief Complaint Chief Complaint  Patient presents with   Rectal Bleeding    HPI Crystal Arellano is a 38 y.o. female.   Patient presents with blood in stool x2. Patient states that Friday and today she noticed a small amount of bright red blood in the toilet after having a bowel movement. Patient states that bowel movements have been difficult to pass and are hard. Denies persistent rectal bleeding and states that it only occurs with bowel movement.   Patient states that one day last week she had some abdominal bloating and vomited once, but denies bloating, nausea, and vomiting at this time. Also denies fever, diarrhea, abdominal pain, and rectal pain.    Rectal Bleeding   Past Medical History:  Diagnosis Date   Costochondritis     There are no active problems to display for this patient.   Past Surgical History:  Procedure Laterality Date   ADENOIDECTOMY     BREAST SURGERY     eat tubes     WISDOM TOOTH EXTRACTION      OB History   No obstetric history on file.      Home Medications    Prior to Admission medications   Medication Sig Start Date End Date Taking? Authorizing Provider  phenylephrine-shark liver oil-mineral oil-petrolatum (PREPARATION H) 0.25-14-74.9 % rectal ointment Place 1 Application rectally 2 (two) times daily as needed for hemorrhoids. 07/28/23  Yes Susann Givens, Bryona Foxworthy A, NP  polyethylene glycol (MIRALAX) 17 g packet Take 17 g by mouth daily. 07/28/23  Yes Wynonia Lawman A, NP  CAPSAICIN HOT PATCH EX Apply 1 patch topically as needed (leg pain). Patient not taking: Reported on 11/16/2019    [provider]  diclofenac sodium (VOLTAREN) 1 % GEL Apply 4 g topically 4 (four) times daily. Patient not taking: Reported on 10/11/2019 03/19/18   Harolyn Rutherford C, PA-C  FLUoxetine (PROZAC) 20 MG capsule TAKE 1 CAPSULE(20 MG) BY MOUTH DAILY 12/20/19    Emily Filbert, MD  methocarbamol (ROBAXIN) 500 MG tablet Take 1 tablet (500 mg total) by mouth 2 (two) times daily. Patient not taking: Reported on 10/11/2019 03/19/18   Harolyn Rutherford C, PA-C  tretinoin (RETIN-A) 0.025 % cream Apply 1 application topically at bedtime.  Patient not taking: Reported on 11/16/2019 01/12/18   [provider]    Family History Family History  Problem Relation Age of Onset   Hyperlipidemia Mother    Breast cancer Mother 71   Breast cancer Maternal Grandmother    Esophageal cancer Maternal Grandmother     Social History Social History   Tobacco Use   Smoking status: Never   Smokeless tobacco: Never  Substance Use Topics   Alcohol use: Yes    Alcohol/week: 0.0 standard drinks of alcohol    Comment: occ   Drug use: No     Allergies   Patient has no known allergies.   Review of Systems Review of Systems  Gastrointestinal:  Positive for hematochezia.   Per HPI  Physical Exam Triage Vital Signs ED Triage Vitals  Encounter Vitals Group     BP 07/28/23 1518 115/73     Systolic BP Percentile --      Diastolic BP Percentile --      Pulse Rate 07/28/23 1518 81     Resp 07/28/23 1518 14     Temp 07/28/23 1518  98.1 F (36.7 C)     Temp Source 07/28/23 1518 Oral     SpO2 07/28/23 1518 98 %     Weight --      Height --      Head Circumference --      Peak Flow --      Pain Score 07/28/23 1516 3     Pain Loc --      Pain Education --      Exclude from Growth Chart --    No data found.  Updated Vital Signs BP 115/73 (BP Location: Right Arm)   Pulse 81   Temp 98.1 F (36.7 C) (Oral)   Resp 14   LMP 07/05/2023   SpO2 98%   Visual Acuity Right Eye Distance:   Left Eye Distance:   Bilateral Distance:    Right Eye Near:   Left Eye Near:    Bilateral Near:     Physical Exam Vitals and nursing note reviewed.  Constitutional:      General: She is awake. She is not in acute distress.    Appearance: Normal appearance. She  is well-developed and well-groomed. She is not ill-appearing.  Abdominal:     General: Abdomen is flat. Bowel sounds are normal. There is no distension.     Palpations: Abdomen is soft.     Tenderness: There is no abdominal tenderness.  Skin:    General: Skin is warm and dry.  Neurological:     Mental Status: She is alert.  Psychiatric:        Behavior: Behavior is cooperative.      UC Treatments / Results  Labs (all labs ordered are listed, but only abnormal results are displayed) Labs Reviewed - No data to display  EKG   Radiology No results found.  Procedures Procedures (including critical care time)  Medications Ordered in UC Medications - No data to display  Initial Impression / Assessment and Plan / UC Course  I have reviewed the triage vital signs and the nursing notes.  Pertinent labs & imaging results that were available during my care of the patient were reviewed by me and considered in my medical decision making (see chart for details).     No significant findings upon exam. Nontender upon palpation of abdomen. Rectal exam declined.  Prescribed preparation H ointment for possible hemorrhoid presence. Prescribed Miralax for constipation. Discussed diet modifications and importance of hydration. Discussed follow-up, return, and strict ER precautions.  Final Clinical Impressions(s) / UC Diagnoses   Final diagnoses:  Rectal bleeding  Constipation, unspecified constipation type     Discharge Instructions      You can apply preparation H ointment to assist with any rectal discomfort and presence of possible hemorrhoid.  Otherwise I recommend taking Miralax once daily to help regulate bowel movements. Ideally, you should be having at least one bowel movement per day.   Try eating a diet high in fiber or taking fiber supplements to assist with constipation. Also be sure to drink lots of water as this can assist with constipation as well.   Keep your  appointment with your new primary care provider in April to follow-up about this issue.  Return here as needed.  If you develop severe abdominal pain, excessive vomiting, increased blood in your stool, or noticed black stool please seek immediate medical treatment in the ER.    ED Prescriptions     Medication Sig Dispense Auth. Provider   phenylephrine-shark liver oil-mineral oil-petrolatum (PREPARATION  H) 0.25-14-74.9 % rectal ointment Place 1 Application rectally 2 (two) times daily as needed for hemorrhoids. 28 g Wynonia Lawman A, NP   polyethylene glycol (MIRALAX) 17 g packet Take 17 g by mouth daily. 14 each Letta Kocher, NP      PDMP not reviewed this encounter.   Wynonia Lawman A, NP 07/28/23 [phone]

## 2023-07-28 NOTE — ED Triage Notes (Signed)
 Pt saw blood in stool on Friday. Today had blood in stools. Pt reports that poop was big and was little hard.  Reports little lower back pains, think might be menstrual cycle about to come on.

## 2023-08-19 ENCOUNTER — Encounter: Payer: Self-pay | Admitting: Family Medicine

## 2023-08-19 ENCOUNTER — Ambulatory Visit: Payer: Self-pay | Attending: Family Medicine | Admitting: Family Medicine

## 2023-08-19 ENCOUNTER — Other Ambulatory Visit (HOSPITAL_COMMUNITY)
Admission: RE | Admit: 2023-08-19 | Discharge: 2023-08-19 | Disposition: A | Discharge status: Home / Self Care | Source: Ambulatory Visit | Attending: Family Medicine | Admitting: Family Medicine

## 2023-08-19 VITALS — BP 115/75 | HR 80 | Ht 65.0 in | Wt 166.6 lb

## 2023-08-19 DIAGNOSIS — N6320 Unspecified lump in the left breast, unspecified quadrant: Secondary | ICD-10-CM

## 2023-08-19 DIAGNOSIS — Z131 Encounter for screening for diabetes mellitus: Secondary | ICD-10-CM

## 2023-08-19 DIAGNOSIS — Z113 Encounter for screening for infections with a predominantly sexual mode of transmission: Secondary | ICD-10-CM | POA: Insufficient documentation

## 2023-08-19 DIAGNOSIS — K5909 Other constipation: Secondary | ICD-10-CM | POA: Diagnosis not present

## 2023-08-19 DIAGNOSIS — K921 Melena: Secondary | ICD-10-CM

## 2023-08-19 DIAGNOSIS — Z124 Encounter for screening for malignant neoplasm of cervix: Secondary | ICD-10-CM | POA: Insufficient documentation

## 2023-08-19 DIAGNOSIS — Z13228 Encounter for screening for other metabolic disorders: Secondary | ICD-10-CM

## 2023-08-19 DIAGNOSIS — Z1159 Encounter for screening for other viral diseases: Secondary | ICD-10-CM

## 2023-08-19 DIAGNOSIS — Z0001 Encounter for general adult medical examination with abnormal findings: Secondary | ICD-10-CM

## 2023-08-19 DIAGNOSIS — Z13220 Encounter for screening for lipoid disorders: Secondary | ICD-10-CM

## 2023-08-19 DIAGNOSIS — Z23 Encounter for immunization: Secondary | ICD-10-CM

## 2023-08-19 DIAGNOSIS — Z1231 Encounter for screening mammogram for malignant neoplasm of breast: Secondary | ICD-10-CM

## 2023-08-19 NOTE — Progress Notes (Signed)
 Subjective:  Patient ID: Crystal Arellano, female    DOB: 1985-07-13  Age: 37 y.o. MRN: [id number]  CC: New Patient (Initial Visit)     Discussed the use of AI scribe software for clinical note transcription with the patient, who gave verbal consent to proceed.  History of Present Illness The patient, with a past medical history of costochondritis and anxiety (previously managed with Prozac), presents for an annual exam and to discuss referrals for a colonoscopy and mammogram. She reports a history of precancerous polyps in her wife, who recently underwent a colonoscopy and polyp removal. The patient also reports irregular bowel movements and recent episodes of blood in her stool. She has experienced abdominal pain and vomiting prior to the bleeding. She denies a personal history of hemorrhoids and has no personal or family history of cancer but would like to have a colonoscopy.  The patient also reports a family history of breast cancer and has recently felt a small lump in her left breast. She has a past history of a similar lump that was surgically removed. She is due for her first mammogram as she is turning 38 next month. She also reports a history of an eye injury resulting in permanent dilation and a possible risk of glaucoma. She exercises regularly and eats healthy, cutting out meat with an adequate intake of fruits and vegetables.  She has seen a dentist in the past but not in the last 1 year.    Past Medical History:  Diagnosis Date   Costochondritis     Past Surgical History:  Procedure Laterality Date   ADENOIDECTOMY     BREAST SURGERY     eat tubes     WISDOM TOOTH EXTRACTION      Family History  Problem Relation Age of Onset   Hyperlipidemia Mother    Breast cancer Mother 18   Breast cancer Maternal Grandmother    Esophageal cancer Maternal Grandmother     Social History   Socioeconomic History   Marital status: Married    Spouse name: Not on file    Number of children: Not on file   Years of education: Not on file   Highest education level: Not on file  Occupational History   Not on file  Tobacco Use   Smoking status: Never   Smokeless tobacco: Never  Substance and Sexual Activity   Alcohol use: Yes    Alcohol/week: 0.0 standard drinks of alcohol    Comment: occ   Drug use: No   Sexual activity: Yes    Birth control/protection: None  Other Topics Concern   Not on file  Social History Narrative   Not on file   Social Drivers of Health   Financial Resource Strain: Not on file  Food Insecurity: Not on file  Transportation Needs: Not on file  Physical Activity: Not on file  Stress: Not on file  Social Connections: Not on file    No Known Allergies  Outpatient Medications Prior to Visit  Medication Sig Dispense Refill   CAPSAICIN HOT PATCH EX Apply 1 patch topically as needed (leg pain). (Patient not taking: Reported on 11/16/2019)     diclofenac sodium (VOLTAREN) 1 % GEL Apply 4 g topically 4 (four) times daily. (Patient not taking: Reported on 08/19/2023) 100 g 0   FLUoxetine (PROZAC) 20 MG capsule TAKE 1 CAPSULE(20 MG) BY MOUTH DAILY (Patient not taking: Reported on 08/19/2023) 90 capsule 0   methocarbamol (ROBAXIN) 500 MG tablet Take 1  tablet (500 mg total) by mouth 2 (two) times daily. (Patient not taking: Reported on 08/19/2023) 20 tablet 0   phenylephrine-shark liver oil-mineral oil-petrolatum (PREPARATION H) 0.25-14-74.9 % rectal ointment Place 1 Application rectally 2 (two) times daily as needed for hemorrhoids. (Patient not taking: Reported on 08/19/2023) 28 g 0   polyethylene glycol (MIRALAX) 17 g packet Take 17 g by mouth daily. (Patient not taking: Reported on 08/19/2023) 14 each 0   tretinoin (RETIN-A) 0.025 % cream Apply 1 application topically at bedtime.  (Patient not taking: Reported on 11/16/2019)  0   No facility-administered medications prior to visit.     ROS Review of Systems  Constitutional:  Negative for  activity change, appetite change and fatigue.  HENT:  Negative for congestion, sinus pressure and sore throat.   Eyes:  Negative for visual disturbance.  Respiratory:  Negative for cough, chest tightness, shortness of breath and wheezing.   Cardiovascular:  Negative for chest pain and palpitations.  Gastrointestinal:  Positive for constipation. Negative for abdominal distention and abdominal pain.  Endocrine: Negative for polydipsia.  Genitourinary:  Negative for dysuria and frequency.  Musculoskeletal:  Negative for arthralgias and back pain.  Skin:  Negative for rash.  Neurological:  Negative for tremors, light-headedness and numbness.  Hematological:  Does not bruise/bleed easily.  Psychiatric/Behavioral:  Negative for agitation and behavioral problems.     Objective:  BP 115/75   Pulse 80   Ht 5\' 5"  (1.651 m)   Wt 166 lb 9.6 oz (75.6 kg)   LMP 07/31/2023   SpO2 99%   BMI 27.72 kg/m      08/19/2023    8:45 AM 07/28/2023    3:18 PM 06/03/2022    4:27 PM  BP/Weight  Systolic BP 115 115 117  Diastolic BP 75 73 82  Wt. (Lbs) 166.6    BMI 27.72 kg/m2        Physical Exam Exam conducted with a chaperone present.  Constitutional:      General: She is not in acute distress.    Appearance: She is well-developed. She is not diaphoretic.  HENT:     Head: Normocephalic.     Right Ear: External ear normal.     Left Ear: External ear normal.     Nose: Nose normal.  Eyes:     Conjunctiva/sclera: Conjunctivae normal.     Pupils: Pupils are equal, round, and reactive to light.  Neck:     Vascular: No JVD.  Cardiovascular:     Rate and Rhythm: Normal rate and regular rhythm.     Heart sounds: Normal heart sounds. No murmur heard.    No gallop.  Pulmonary:     Effort: Pulmonary effort is normal. No respiratory distress.     Breath sounds: Normal breath sounds. No wheezing or rales.  Chest:     Chest wall: No tenderness.  Breasts:    Right: Normal. No mass, nipple discharge  or tenderness.     Left: Normal. No mass, nipple discharge or tenderness.  Abdominal:     General: Bowel sounds are normal. There is no distension.     Palpations: Abdomen is soft. There is no mass.     Tenderness: There is no abdominal tenderness.     Hernia: There is no hernia in the left inguinal area or right inguinal area.  Genitourinary:    General: Normal vulva.     Pubic Area: No rash.      Labia:  Right: No rash.        Left: No rash.      Vagina: Normal.     Cervix: Normal.     Uterus: Normal.      Adnexa: Right adnexa normal and left adnexa normal.       Right: No tenderness.         Left: No tenderness.    Musculoskeletal:        General: No tenderness. Normal range of motion.     Cervical back: Normal range of motion. No tenderness.  Lymphadenopathy:     Upper Body:     Right upper body: No supraclavicular or axillary adenopathy.     Left upper body: No supraclavicular or axillary adenopathy.  Skin:    General: Skin is warm and dry.  Neurological:     Mental Status: She is alert and oriented to person, place, and time.     Deep Tendon Reflexes: Reflexes are normal and symmetric.        Latest Ref Rng & Units 11/01/2019    9:05 AM  CMP  Glucose 65 - 99 mg/dL 87   BUN 6 - 20 mg/dL 11   Creatinine 4.09 - 1.00 mg/dL 8.11   Sodium 914 - 782 mmol/L 138   Potassium 3.5 - 5.2 mmol/L 4.4   Chloride 96 - 106 mmol/L 105   Calcium 8.7 - 10.2 mg/dL 8.9   Total Protein 6.0 - 8.5 g/dL 7.1   Total Bilirubin 0.0 - 1.2 mg/dL 0.3   Alkaline Phos 48 - 121 IU/L 57   AST 0 - 40 IU/L 13   ALT 0 - 32 IU/L 9     Lipid Panel     Component Value Date/Time   CHOL 237 (H) 11/01/2019 0905   TRIG 58 11/01/2019 0905   HDL 53 11/01/2019 0905   CHOLHDL 4.5 (H) 11/01/2019 0905   LDLCALC 174 (H) 11/01/2019 0905    CBC    Component Value Date/Time   WBC 5.0 11/01/2019 0905   RBC 4.64 11/01/2019 0905   HGB 12.5 11/01/2019 0905   HCT 39.1 11/01/2019 0905   PLT 401  11/01/2019 0905   MCV 84 11/01/2019 0905   MCH 26.9 11/01/2019 0905   MCHC 32.0 11/01/2019 0905   RDW 13.6 11/01/2019 0905   LYMPHSABS 1.9 11/01/2019 0905   EOSABS 0.0 11/01/2019 0905   BASOSABS 0.1 11/01/2019 0905    No results found for: "HGBA1C"     Assessment & Plan No physical exam with abnormal findings Due for routine screenings.  - Requires cholesterol screening. - Order screening mammogram as she is turning 38 soon. - Perform Pap smear for cervical cancer screening, STD screening - Check cholesterol levels, ensuring she is fasting. -Tdap administered  Constipation with hematochezia Constipation with hematochezia and abdominal pain.  Hemorrhoids suspected. -She is not of age for colon cancer screening and does not have a family history of colon cancer but prefers to be referred for colonoscopy - Refer to gastroenterology for evaluation and possible colonoscopy palpation request. - Recommend over-the-counter Miralax for constipation management. - Advise increasing dietary fiber intake through fruits and vegetables.  Breast lump Small lump in left breast with family history of breast cancer and dense breast tissue. Further evaluation needed. - Order diagnostic mammogram for further evaluation of the breast lump.   Additional screening test Will screen for viral diseases, screening for diabetes mellitus     No orders of the defined types were placed  in this encounter.   Follow-up: No follow-ups on file.       Hoy Register, MD, FAAFP. [facility] [place], [place] [phone]   08/19/2023, 8:49 AM

## 2023-08-19 NOTE — Patient Instructions (Signed)
 VISIT SUMMARY:  During your visit today, we discussed your annual exam and addressed your concerns about irregular bowel movements, blood in your stool, and a small lump in your left breast. We also reviewed your family history of precancerous polyps and breast cancer, and planned for necessary screenings and referrals.  YOUR PLAN:  -CONSTIPATION WITH HEMATOCHEZIA: Constipation with hematochezia means you are experiencing difficulty with bowel movements along with blood in your stool. Given your family history of precancerous polyps, we are referring you to a gastroenterologist for further evaluation and a possible colonoscopy. In the meantime, you can manage constipation with over-the-counter Miralax and by increasing your dietary fiber intake through fruits and vegetables.  -BREAST LUMP: A breast lump can be a sign of various conditions, including breast cancer, especially with your family history. We have ordered a diagnostic mammogram to further evaluate the lump in your left breast.  -ROUTINE HEALTH MAINTENANCE: Routine health maintenance includes regular screenings to monitor your overall health. We have ordered a screening mammogram as you are turning 38 soon, a Pap smear for cervical cancer screening, and a cholesterol check, which requires you to be fasting.  INSTRUCTIONS:  Please follow up with the gastroenterologist for your colonoscopy and attend your scheduled diagnostic mammogram. Ensure you are fasting before your cholesterol test. Continue to monitor your symptoms and report any changes.

## 2023-08-20 ENCOUNTER — Encounter: Payer: Self-pay | Admitting: Family Medicine

## 2023-08-20 LAB — CMP14+EGFR
ALT: 10 IU/L (ref 0–32)
AST: 15 IU/L (ref 0–40)
Albumin: 4.4 g/dL (ref 3.9–4.9)
Alkaline Phosphatase: 71 IU/L (ref 44–121)
BUN/Creatinine Ratio: 15 (ref 9–23)
BUN: 12 mg/dL (ref 6–20)
Bilirubin Total: 0.3 mg/dL (ref 0.0–1.2)
CO2: 20 mmol/L (ref 20–29)
Calcium: 9.3 mg/dL (ref 8.7–10.2)
Chloride: 106 mmol/L (ref 96–106)
Creatinine, Ser: 0.8 mg/dL (ref 0.57–1.00)
Globulin, Total: 2.4 g/dL (ref 1.5–4.5)
Glucose: 88 mg/dL (ref 70–99)
Potassium: 4.3 mmol/L (ref 3.5–5.2)
Sodium: 140 mmol/L (ref 134–144)
Total Protein: 6.8 g/dL (ref 6.0–8.5)
eGFR: 96 mL/min/{1.73_m2} (ref >59)

## 2023-08-20 LAB — LP+NON-HDL CHOLESTEROL
Cholesterol, Total: 191 mg/dL (ref 100–199)
HDL: 53 mg/dL (ref >39)
LDL Chol Calc (NIH): 123 mg/dL — ABNORMAL HIGH (ref 0–99)
Total Non-HDL-Chol (LDL+VLDL): 138 mg/dL — ABNORMAL HIGH (ref 0–129)
Triglycerides: 83 mg/dL (ref 0–149)
VLDL Cholesterol Cal: 15 mg/dL (ref 5–40)

## 2023-08-20 LAB — CERVICOVAGINAL ANCILLARY ONLY
Bacterial Vaginitis (gardnerella): NEGATIVE
Candida Glabrata: NEGATIVE
Candida Vaginitis: NEGATIVE
Chlamydia: NEGATIVE
Comment: NEGATIVE
Comment: NEGATIVE
Comment: NEGATIVE
Comment: NEGATIVE
Comment: NEGATIVE
Comment: NORMAL
Neisseria Gonorrhea: NEGATIVE
Trichomonas: NEGATIVE

## 2023-08-20 LAB — CBC WITH DIFFERENTIAL/PLATELET
Basophils Absolute: 0.1 10*3/uL (ref 0.0–0.2)
Basos: 1 %
EOS (ABSOLUTE): 0.1 10*3/uL (ref 0.0–0.4)
Eos: 2 %
Hematocrit: 37 % (ref 34.0–46.6)
Hemoglobin: 12 g/dL (ref 11.1–15.9)
Immature Grans (Abs): 0 10*3/uL (ref 0.0–0.1)
Immature Granulocytes: 0 %
Lymphocytes Absolute: 1.9 10*3/uL (ref 0.7–3.1)
Lymphs: 34 %
MCH: 26.5 pg — ABNORMAL LOW (ref 26.6–33.0)
MCHC: 32.4 g/dL (ref 31.5–35.7)
MCV: 82 fL (ref 79–97)
Monocytes Absolute: 0.4 10*3/uL (ref 0.1–0.9)
Monocytes: 7 %
Neutrophils Absolute: 3.1 10*3/uL (ref 1.4–7.0)
Neutrophils: 56 %
Platelets: 340 10*3/uL (ref 150–450)
RBC: 4.52 x10E6/uL (ref 3.77–5.28)
RDW: 14 % (ref 11.7–15.4)
WBC: 5.5 10*3/uL (ref 3.4–10.8)

## 2023-08-20 LAB — CYTOLOGY - PAP
Adequacy: ABSENT
Comment: NEGATIVE
Diagnosis: NEGATIVE
High risk HPV: NEGATIVE

## 2023-08-20 LAB — HEMOGLOBIN A1C
Est. average glucose Bld gHb Est-mCnc: 105 mg/dL
Hgb A1c MFr Bld: 5.3 % (ref 4.8–5.6)

## 2023-08-20 LAB — HCV INTERPRETATION

## 2023-08-20 LAB — HIV ANTIBODY (ROUTINE TESTING W REFLEX): HIV Screen 4th Generation wRfx: NONREACTIVE

## 2023-08-20 LAB — HCV AB W REFLEX TO QUANT PCR: HCV Ab: NONREACTIVE

## 2023-09-10 ENCOUNTER — Encounter: Payer: Self-pay | Admitting: Internal Medicine

## 2023-09-17 ENCOUNTER — Ambulatory Visit
Admission: RE | Admit: 2023-09-17 | Discharge: 2023-09-17 | Disposition: A | Discharge status: Home / Self Care | Source: Ambulatory Visit | Attending: Family Medicine | Admitting: Family Medicine

## 2023-09-17 ENCOUNTER — Other Ambulatory Visit: Payer: Self-pay | Admitting: Family Medicine

## 2023-09-17 DIAGNOSIS — N6320 Unspecified lump in the left breast, unspecified quadrant: Secondary | ICD-10-CM

## 2023-09-17 DIAGNOSIS — N6310 Unspecified lump in the right breast, unspecified quadrant: Secondary | ICD-10-CM

## 2023-09-18 ENCOUNTER — Encounter: Payer: Self-pay | Admitting: Family Medicine

## 2023-09-28 ENCOUNTER — Other Ambulatory Visit: Payer: Self-pay | Admitting: Family Medicine

## 2023-09-28 DIAGNOSIS — N6320 Unspecified lump in the left breast, unspecified quadrant: Secondary | ICD-10-CM

## 2023-09-28 DIAGNOSIS — N6310 Unspecified lump in the right breast, unspecified quadrant: Secondary | ICD-10-CM

## 2023-09-29 ENCOUNTER — Encounter: Payer: Self-pay | Admitting: Family Medicine

## 2023-11-11 ENCOUNTER — Encounter: Payer: Self-pay | Admitting: Internal Medicine

## 2023-11-11 ENCOUNTER — Ambulatory Visit: Admitting: Internal Medicine

## 2023-11-11 VITALS — BP 102/72 | HR 87 | Ht 65.0 in | Wt 164.0 lb

## 2023-11-11 DIAGNOSIS — K5900 Constipation, unspecified: Secondary | ICD-10-CM | POA: Diagnosis not present

## 2023-11-11 DIAGNOSIS — K625 Hemorrhage of anus and rectum: Secondary | ICD-10-CM

## 2023-11-11 DIAGNOSIS — K5901 Slow transit constipation: Secondary | ICD-10-CM

## 2023-11-11 MED ORDER — NA SULFATE-K SULFATE-MG SULF 17.5-3.13-1.6 GM/177ML PO SOLN: 1.0000 | Freq: Once | ORAL | 0 refills | Status: AC

## 2023-11-11 NOTE — Patient Instructions (Signed)
 You have been scheduled for a colonoscopy. Please follow written instructions given to you at your visit today.   If you use inhalers (even only as needed), please bring them with you on the day of your procedure.  DO NOT TAKE 7 DAYS PRIOR TO TEST- Trulicity (dulaglutide) Ozempic, Wegovy (semaglutide) Mounjaro (tirzepatide) Bydureon Bcise (exanatide extended release)  DO NOT TAKE 1 DAY PRIOR TO YOUR TEST Rybelsus (semaglutide) Adlyxin (lixisenatide) Victoza (liraglutide) Byetta (exanatide) ___________________________________________________________________________  _______________________________________________________  If your blood pressure at your visit was 140/90 or greater, please contact your primary care physician to follow up on this.  _______________________________________________________  If you are age 33 or older, your body mass index should be between 23-30. Your Body mass index is 27.29 kg/m. If this is out of the aforementioned range listed, please consider follow up with your Primary Care Provider.  If you are age 62 or younger, your body mass index should be between 19-25. Your Body mass index is 27.29 kg/m. If this is out of the aformentioned range listed, please consider follow up with your Primary Care Provider.   ________________________________________________________  The [facility] providers would like to encourage you to use MYCHART to communicate with providers for non-urgent requests or questions.  Due to long hold times on the telephone, sending your provider a message by [facility] may be a faster and more efficient way to get a response.  Please allow 48 business hours for a response.  Please remember that this is for non-urgent requests.  _______________________________________________________

## 2023-11-11 NOTE — Progress Notes (Signed)
 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  Patient is new to this practice.  Patient has no prior GI history.  She reports intermittent problems with constipation for which she takes colon cleanse pills.  These help.  She describes 2 episodes of painless rectal bleeding without straining.  She noticed blood leaking out of the stool in the toilet bowl.  She denies abdominal pain but has had a lump like sensation in the epigastric region.  No family history of colon cancer, though her spouse did have precancerous colon polyp.  Patient's GI review of systems is otherwise negative.  Laboratories: Blood work from August 19, 2023 shows normal comprehensive metabolic panel and a normal CBC with hemoglobin 12.0.  Hemoglobin A1c 5.3  REVIEW OF SYSTEMS:  All non-GI ROS negative unless otherwise stated in the HPI. Past Medical History:  Diagnosis Date   Costochondritis    Depression     Past Surgical History:  Procedure Laterality Date   ADENOIDECTOMY     BREAST SURGERY     eat tubes     WISDOM TOOTH EXTRACTION      Social History Crystal Arellano  reports that she has never smoked. She has never used smokeless tobacco. She reports current alcohol use. She reports that she does not use drugs.  family history includes Breast cancer in her maternal grandmother; Breast cancer (age of onset: 88) in her mother; Esophageal cancer in her maternal grandmother; Hyperlipidemia in her mother.  No Known Allergies     PHYSICAL EXAMINATION: Vital signs: BP 102/72   Pulse 87   Ht 5' 5 (1.651 m)   Wt 164 lb (74.4 kg)   BMI 27.29 kg/m   Constitutional: generally well-appearing, no acute distress Psychiatric: alert and oriented x3, cooperative Eyes: extraocular movements intact, anicteric, conjunctiva pink Mouth: oral  pharynx moist, no lesions Neck: supple no lymphadenopathy Cardiovascular: heart regular rate and rhythm, no murmur Lungs: clear to auscultation bilaterally Abdomen: soft, nontender, nondistended, no obvious ascites, no peritoneal signs, normal bowel sounds, no organomegaly Rectal: Deferred until colonoscopy Extremities: no clubbing, cyanosis, or lower extremity edema bilaterally Skin: no lesions on visible extremities save tattoos Neuro: No focal deficits.  Cranial nerves intact  ASSESSMENT:  1.  Several episodes of minor rectal bleeding as described. 2.  Constipation 3.  Lump like sensation epigastrium.  Normal physical exam.   PLAN:  1.  Colonoscopy to evaluate rectal bleeding and constipation.The nature of the procedure, as well as the risks, benefits, and alternatives were carefully and thoroughly reviewed with the patient. Ample time for discussion and questions allowed. The patient understood, was satisfied, and agreed to proceed. 2.  Further recommendations after the above 3.  Ongoing general medical care with Dr. Newlin

## 2023-12-07 ENCOUNTER — Encounter: Payer: Self-pay | Admitting: Internal Medicine

## 2023-12-10 ENCOUNTER — Encounter: Payer: Self-pay | Admitting: Family Medicine

## 2023-12-10 ENCOUNTER — Other Ambulatory Visit: Payer: Self-pay | Admitting: Family Medicine

## 2023-12-10 DIAGNOSIS — N63 Unspecified lump in unspecified breast: Secondary | ICD-10-CM

## 2023-12-15 ENCOUNTER — Ambulatory Visit: Admitting: Internal Medicine

## 2023-12-15 ENCOUNTER — Encounter: Payer: Self-pay | Admitting: Internal Medicine

## 2023-12-15 VITALS — BP 100/70 | HR 89 | Temp 97.6°F | Resp 18 | Ht 65.0 in | Wt 164.0 lb

## 2023-12-15 DIAGNOSIS — D125 Benign neoplasm of sigmoid colon: Secondary | ICD-10-CM

## 2023-12-15 DIAGNOSIS — K625 Hemorrhage of anus and rectum: Secondary | ICD-10-CM

## 2023-12-15 DIAGNOSIS — K635 Polyp of colon: Secondary | ICD-10-CM

## 2023-12-15 DIAGNOSIS — K5901 Slow transit constipation: Secondary | ICD-10-CM

## 2023-12-15 MED ORDER — SODIUM CHLORIDE 0.9 % IV SOLN: 500.0000 mL | Freq: Once | INTRAVENOUS | Status: DC

## 2023-12-15 NOTE — Op Note (Signed)
 [facility] Patient Name: Crystal Arellano Procedure Date: 12/15/2023 3:03 PM MRN: [id number] Endoscopist: Norleen SAILOR. Abran , MD, [id number] Age: 38 Referring MD:  Date of Birth: [birth year] Gender: Female Account #: [id number] Procedure:                Colonoscopy with cold snare polypectomy x 1 Indications:              Rectal bleeding, Change in bowel habits,                            Constipation Medicines:                Monitored Anesthesia Care Procedure:                Pre-Anesthesia Assessment:                           - Prior to the procedure, a History and Physical                            was performed, and patient medications and                            allergies were reviewed. The patient's tolerance of                            previous anesthesia was also reviewed. The risks                            and benefits of the procedure and the sedation                            options and risks were discussed with the patient.                            All questions were answered, and informed consent                            was obtained. Prior Anticoagulants: The patient has                            taken no anticoagulant or antiplatelet agents. ASA                            Grade Assessment: II - A patient with mild systemic                            disease. After reviewing the risks and benefits,                            the patient was deemed in satisfactory condition to                            undergo the procedure.  After obtaining informed consent, the colonoscope                            was passed under direct vision. Throughout the                            procedure, the patient's blood pressure, pulse, and                            oxygen saturations were monitored continuously. The                            Olympus Scope SN: [id number] was introduced through                            the anus and  advanced to the the cecum, identified                            by appendiceal orifice and ileocecal valve. The                            ileocecal valve, appendiceal orifice, and rectum                            were photographed. The quality of the bowel                            preparation was excellent. The colonoscopy was                            performed without difficulty. The patient tolerated                            the procedure well. The bowel preparation used was                            SUPREP via split dose instruction. Scope In: 3:26:50 PM Scope Out: 3:39:19 PM Scope Withdrawal Time: 0 hours 9 minutes 36 seconds  Total Procedure Duration: 0 hours 12 minutes 29 seconds  Findings:                 A 4 mm polyp was found in the sigmoid colon. The                            polyp was sessile. The polyp was removed with a                            cold snare. Resection and retrieval were complete.                           The exam was otherwise without abnormality on                            direct and  retroflexion views. Small internal                            hemorrhoids. Complications:            No immediate complications. Estimated blood loss:                            None. Estimated Blood Loss:     Estimated blood loss: none. Impression:               - One 4 mm polyp in the sigmoid colon, removed with                            a cold snare. Resected and retrieved.                           - The examination was otherwise normal on direct                            and retroflexion views. Recommendation:           - Repeat colonoscopy in 7-10 years for surveillance.                           - Patient has a contact number available for                            emergencies. The signs and symptoms of potential                            delayed complications were discussed with the                            patient. Return to normal activities  tomorrow.                            Written discharge instructions were provided to the                            patient.                           - Resume previous diet.                           - Continue present medications.                           - Await pathology results. Norleen SAILOR. Abran, MD 12/15/2023 3:46:22 PM This report has been signed electronically.

## 2023-12-15 NOTE — Patient Instructions (Signed)

## 2023-12-15 NOTE — Progress Notes (Unsigned)
 Called to room to assist during endoscopic procedure.  Patient ID and intended procedure confirmed with present staff. Received instructions for my participation in the procedure from the performing physician.

## 2023-12-15 NOTE — Progress Notes (Signed)
 Pt's states no medical or surgical changes since previsit or office visit.

## 2023-12-15 NOTE — Progress Notes (Unsigned)
 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  Patient is new to this practice.   Patient has no prior GI history.  She reports intermittent problems with constipation for which she takes colon cleanse pills.  These help.  She describes 2 episodes of painless rectal bleeding without straining.  She noticed blood leaking out of the stool in the toilet bowl.  She denies abdominal pain but has had a lump like sensation in the epigastric region.  No family history of colon cancer, though her spouse did have precancerous colon polyp.   Patient's GI review of systems is otherwise negative.   Laboratories: Blood work from August 19, 2023 shows normal comprehensive metabolic panel and a normal CBC with hemoglobin 12.0.  Hemoglobin A1c 5.3   REVIEW OF SYSTEMS:   All non-GI ROS negative unless otherwise stated in the HPI.     Past Medical History:  Diagnosis Date   Costochondritis     Depression                 Past Surgical History:  Procedure Laterality Date   ADENOIDECTOMY       BREAST SURGERY       eat tubes       WISDOM TOOTH EXTRACTION              Social History Crystal Arellano  reports that she has never smoked. She has never used smokeless tobacco. She reports current alcohol use. She reports that she does not use drugs.   family history includes Breast cancer in her maternal grandmother; Breast cancer (age of onset: 83) in her mother; Esophageal cancer in her maternal grandmother; Hyperlipidemia in her mother.   Allergies  No Known Allergies         PHYSICAL EXAMINATION: Vital signs: BP 102/72   Pulse 87   Ht 5' 5 (1.651 m)   Wt 164 lb (74.4 kg)   BMI 27.29 kg/m   Constitutional: generally well-appearing, no acute distress Psychiatric: alert and oriented x3,  cooperative Eyes: extraocular movements intact, anicteric, conjunctiva pink Mouth: oral pharynx moist, no lesions Neck: supple no lymphadenopathy Cardiovascular: heart regular rate and rhythm, no murmur Lungs: clear to auscultation bilaterally Abdomen: soft, nontender, nondistended, no obvious ascites, no peritoneal signs, normal bowel sounds, no organomegaly Rectal: Deferred until colonoscopy Extremities: no clubbing, cyanosis, or lower extremity edema bilaterally Skin: no lesions on visible extremities save tattoos Neuro: No focal deficits.  Cranial nerves intact   ASSESSMENT:   1.  Several episodes of minor rectal bleeding as described. 2.  Constipation 3.  Lump like sensation epigastrium.  Normal physical exam.     PLAN:   1.  Colonoscopy to evaluate rectal bleeding and constipation.The nature of the procedure, as well as the risks, benefits, and alternatives were carefully and thoroughly reviewed with the patient. Ample time for discussion and questions allowed. The patient understood, was satisfied, and agreed to proceed. 2.  Further recommendations after the above 3.  Ongoing general medical care with Dr. Newlin

## 2023-12-15 NOTE — Progress Notes (Unsigned)
 A/o x 3, VSS, gd SR's, pleased with anesthesia, report to RN

## 2023-12-16 ENCOUNTER — Telehealth: Payer: Self-pay | Admitting: *Deleted

## 2023-12-16 NOTE — Telephone Encounter (Signed)
 No answer on  follow up call. Left message.

## 2023-12-18 ENCOUNTER — Ambulatory Visit
Admission: RE | Admit: 2023-12-18 | Discharge: 2023-12-18 | Disposition: A | Discharge status: Home / Self Care | Source: Ambulatory Visit | Attending: Family Medicine | Admitting: Family Medicine

## 2023-12-18 ENCOUNTER — Ambulatory Visit: Payer: Self-pay | Admitting: Internal Medicine

## 2023-12-18 ENCOUNTER — Other Ambulatory Visit: Payer: Self-pay | Admitting: Family Medicine

## 2023-12-18 DIAGNOSIS — N63 Unspecified lump in unspecified breast: Secondary | ICD-10-CM

## 2023-12-18 LAB — SURGICAL PATHOLOGY

## 2023-12-19 ENCOUNTER — Ambulatory Visit: Payer: Self-pay | Admitting: Family Medicine

## 2023-12-22 ENCOUNTER — Telehealth: Payer: Self-pay | Admitting: Internal Medicine

## 2023-12-22 NOTE — Telephone Encounter (Signed)
 Pt had colon done 7/29. States on Friday 8/1 she had pain on her left side (she wants to say it was below her belly button) when having a BM, states the pain was intense. States the pain was where she thought the polyp was removed.Reports no fever. States she has had some dizziness and yesterday she vomited. Pt wanted to know if she needed to do anything different or if this could all be related to procedure/anesthesia. Please advise.

## 2023-12-22 NOTE — Telephone Encounter (Signed)
 1.  I appreciate the call. 2.  Though never impossible, it would be highly unlikely that her symptoms are procedure related, particular at this point. 3.  Continue with observation at this point.  However, if things were to worsen, she should probably proceed to the emergency room for thorough evaluation/investigation.  Otherwise, if she gradually improves, nothing else planned. Thanks, Dr. Abran

## 2023-12-22 NOTE — Telephone Encounter (Signed)
 Inbound call from patient stating she recently had a colonoscopy on 7/29 and would like to speak to nurse since she's been experiencing some dizziness and left side pain. Patient also stated she threw up yesterday  Requesting a call back   Please advise  Thank you

## 2023-12-23 NOTE — Telephone Encounter (Signed)
Detailed message left on pt's voicemail

## 2023-12-29 ENCOUNTER — Other Ambulatory Visit

## 2024-03-21 ENCOUNTER — Ambulatory Visit
Admission: RE | Admit: 2024-03-21 | Discharge: 2024-03-21 | Disposition: A | Source: Ambulatory Visit | Attending: Family Medicine | Admitting: Family Medicine

## 2024-03-21 ENCOUNTER — Ambulatory Visit
Admission: RE | Admit: 2024-03-21 | Discharge: 2024-03-21 | Disposition: A | Discharge status: Home / Self Care | Source: Ambulatory Visit | Attending: Family Medicine | Admitting: Family Medicine

## 2024-03-21 ENCOUNTER — Other Ambulatory Visit: Payer: Self-pay | Admitting: Family Medicine

## 2024-03-21 DIAGNOSIS — N63 Unspecified lump in unspecified breast: Secondary | ICD-10-CM

## 2024-03-22 ENCOUNTER — Ambulatory Visit: Payer: Self-pay | Admitting: Family Medicine

## 2024-08-18 ENCOUNTER — Encounter: Admitting: Family Medicine
